# Patient Record
Sex: Female | Born: 1965
Health system: Southern US, Community
[De-identification: ages and names within clinical notes are randomized; demographics above are authoritative.]

## PROBLEM LIST (undated history)

## (undated) DIAGNOSIS — M25372 Other instability, left ankle: Secondary | ICD-10-CM

## (undated) DIAGNOSIS — I10 Essential (primary) hypertension: Secondary | ICD-10-CM

## (undated) DIAGNOSIS — R531 Weakness: Secondary | ICD-10-CM

## (undated) DIAGNOSIS — R200 Anesthesia of skin: Secondary | ICD-10-CM

## (undated) DIAGNOSIS — M21372 Foot drop, left foot: Secondary | ICD-10-CM

## (undated) DIAGNOSIS — F419 Anxiety disorder, unspecified: Secondary | ICD-10-CM

## (undated) DIAGNOSIS — M6702 Short Achilles tendon (acquired), left ankle: Secondary | ICD-10-CM

## (undated) HISTORY — DX: Essential (primary) hypertension: I10

## (undated) HISTORY — DX: Short Achilles tendon (acquired), left ankle: M67.02

## (undated) HISTORY — DX: Other instability, left ankle: M25.372

## (undated) HISTORY — DX: Weakness: R53.1

## (undated) HISTORY — DX: Foot drop, left foot: M21.372

## (undated) HISTORY — DX: Anxiety disorder, unspecified: F41.9

## (undated) HISTORY — DX: Anesthesia of skin: R20.0

---
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
## 2017-03-11 DIAGNOSIS — F331 Major depressive disorder, recurrent, moderate: Secondary | ICD-10-CM | POA: Diagnosis not present

## 2017-03-11 DIAGNOSIS — F411 Generalized anxiety disorder: Secondary | ICD-10-CM | POA: Diagnosis not present

## 2017-04-29 DIAGNOSIS — F331 Major depressive disorder, recurrent, moderate: Secondary | ICD-10-CM | POA: Diagnosis not present

## 2017-04-29 DIAGNOSIS — F411 Generalized anxiety disorder: Secondary | ICD-10-CM | POA: Diagnosis not present

## 2017-05-26 DIAGNOSIS — Z6825 Body mass index (BMI) 25.0-25.9, adult: Secondary | ICD-10-CM | POA: Diagnosis not present

## 2017-05-26 DIAGNOSIS — N951 Menopausal and female climacteric states: Secondary | ICD-10-CM | POA: Diagnosis not present

## 2017-05-26 DIAGNOSIS — R5382 Chronic fatigue, unspecified: Secondary | ICD-10-CM | POA: Diagnosis not present

## 2017-06-10 DIAGNOSIS — F331 Major depressive disorder, recurrent, moderate: Secondary | ICD-10-CM | POA: Diagnosis not present

## 2017-06-10 DIAGNOSIS — F411 Generalized anxiety disorder: Secondary | ICD-10-CM | POA: Diagnosis not present

## 2017-08-14 DIAGNOSIS — F411 Generalized anxiety disorder: Secondary | ICD-10-CM | POA: Diagnosis not present

## 2017-08-14 DIAGNOSIS — F331 Major depressive disorder, recurrent, moderate: Secondary | ICD-10-CM | POA: Diagnosis not present

## 2017-09-08 DIAGNOSIS — E039 Hypothyroidism, unspecified: Secondary | ICD-10-CM | POA: Diagnosis not present

## 2017-09-08 DIAGNOSIS — M6281 Muscle weakness (generalized): Secondary | ICD-10-CM | POA: Diagnosis not present

## 2017-09-08 DIAGNOSIS — R292 Abnormal reflex: Secondary | ICD-10-CM | POA: Diagnosis not present

## 2017-09-08 DIAGNOSIS — F329 Major depressive disorder, single episode, unspecified: Secondary | ICD-10-CM | POA: Diagnosis not present

## 2017-10-13 ENCOUNTER — Telehealth: Payer: Self-pay | Admitting: *Deleted

## 2017-10-13 NOTE — Telephone Encounter (Signed)
Called again and was able to speak with patient and her husband. They verbalized understanding that the office is closed tomorrow and have asked for an appt ASAP as the patient has been waiting over a month to be seen and they both took off work for appt. I expressed my sincere apologies and explained that the snow has caused the roads to be unsafe and we would reopen as soon as we could and call patients back to reschedule. They do not want to wait another month to be seen. Patient is available Thursday, Friday, Saturday. I informed husband that we had patients scheduled that day but we would do the best we could to get her in. He verbalized understanding.

## 2017-10-14 ENCOUNTER — Ambulatory Visit: Payer: BLUE CROSS/BLUE SHIELD | Admitting: Neurology

## 2017-10-15 ENCOUNTER — Other Ambulatory Visit: Payer: Self-pay | Admitting: Neurology

## 2017-10-15 ENCOUNTER — Ambulatory Visit: Payer: BLUE CROSS/BLUE SHIELD | Admitting: Neurology

## 2017-10-15 ENCOUNTER — Telehealth: Payer: Self-pay | Admitting: Neurology

## 2017-10-15 ENCOUNTER — Encounter (INDEPENDENT_AMBULATORY_CARE_PROVIDER_SITE_OTHER): Payer: Self-pay

## 2017-10-15 ENCOUNTER — Encounter: Payer: Self-pay | Admitting: Neurology

## 2017-10-15 VITALS — BP 103/66 | HR 72 | Ht 64.0 in | Wt 140.4 lb

## 2017-10-15 DIAGNOSIS — G1221 Amyotrophic lateral sclerosis: Secondary | ICD-10-CM

## 2017-10-15 DIAGNOSIS — G959 Disease of spinal cord, unspecified: Secondary | ICD-10-CM | POA: Diagnosis not present

## 2017-10-15 DIAGNOSIS — R5382 Chronic fatigue, unspecified: Secondary | ICD-10-CM | POA: Diagnosis not present

## 2017-10-15 DIAGNOSIS — R29818 Other symptoms and signs involving the nervous system: Secondary | ICD-10-CM | POA: Diagnosis not present

## 2017-10-15 DIAGNOSIS — R251 Tremor, unspecified: Secondary | ICD-10-CM | POA: Diagnosis not present

## 2017-10-15 DIAGNOSIS — R258 Other abnormal involuntary movements: Secondary | ICD-10-CM | POA: Diagnosis not present

## 2017-10-15 DIAGNOSIS — R292 Abnormal reflex: Secondary | ICD-10-CM

## 2017-10-15 DIAGNOSIS — G35 Multiple sclerosis: Secondary | ICD-10-CM | POA: Diagnosis not present

## 2017-10-15 DIAGNOSIS — G35D Multiple sclerosis, unspecified: Secondary | ICD-10-CM

## 2017-10-15 DIAGNOSIS — G1229 Other motor neuron disease: Secondary | ICD-10-CM

## 2017-10-15 DIAGNOSIS — R531 Weakness: Secondary | ICD-10-CM

## 2017-10-15 MED ORDER — ALPRAZOLAM 0.5 MG PO TABS: ORAL_TABLET | ORAL | 0 refills | Status: DC

## 2017-10-15 MED ORDER — ALPRAZOLAM 0.25 MG PO TABS: ORAL_TABLET | ORAL | 0 refills | Status: DC

## 2017-10-15 NOTE — Telephone Encounter (Signed)
See if she can come today thanks 

## 2017-10-15 NOTE — Progress Notes (Signed)
[facility]    Provider:  Dr Jaynee Eagles Referring Provider: Manon Hilding, MD Primary Care Physician:  Manon Hilding, MD  CC:  Muscle weakness, hyperreflexia, tremor, hands shake  HPI:  Megan Wells is a 51 y.o. female here as a referral from Manon Hilding, MD for hyperreflexia and leg weakness.  Past medical history of hypothyroidism, major depression on lithium and multiple other mood drugs.  Muscle weakness which is generalized, abnormal weight loss, arthralgia of left TMJ.  She started having tremors over a year ago in the hands, now in the legs she feels her left leg is weak and she can;t pick it up she falls. She has had 4 falls. Leg is continuously weak but episodic with flares some days can walk better. Tremors are worse in the morning. Tremors are more when she is doing something, action, drinking not when sitting still. Slowly progressive leg weakness. Tremor is stable. When the tremor started no medication changes or inciting events, Lexapro was added 7 months ago but has been on the lithium and wellbutrin for years but she did increase the Lithium 2 or 3 years ago. She has low back pain and has arthritis in her low back, she is also on No tremors in the family. No pain in the low back, no radicular symptoms,  o numbness, tingling, weakness in the left leg but no other weakness. No FHx of autoimmune disease, MS, neuromuscular disorder or tremor, no changes in bowel or bladder. Her leg gives out, she cam;t pick it up. No muscle wasting or weight loss, no problems, speaking, swallowing, chewing, she feels unstable.   Reviewed notes, labs and imaging from outside physicians, which showed:   Reviewed primary care notes.  She presented with jaw pain on the left.  Acute, gradual in onset 2 weeks prior, in addition she presented with leg pain, left sciatic weakness, acute restarted 3-4 weeks ago also dyskinesia or tremor diffusely, described as acute, gradual in onset and  ongoing.  She is on lithium, he Spyro, Lexapro, bupropion.  The patient has anxiety and hypothyroid.And apparently C met, CBC, TSH and CK were ordered but I do not have the results.   Review of Systems: Patient complains of symptoms per HPI as well as the following symptoms: increased thirst, memory loss, sleepiness, weakness, dizziness, tremor, depression, anxiety. Pertinent negatives and positives per HPI. All others negative.   Social History   Socioeconomic History  . Marital status: Married    Spouse name: Not on file  . Number of children: 2  . Years of education: Not on file  . Highest education level: Bachelor's degree (e.g., BA, AB, BS)  Social Needs  . Financial resource strain: Not on file  . Food insecurity - worry: Not on file  . Food insecurity - inability: Not on file  . Transportation needs - medical: Not on file  . Transportation needs - non-medical: Not on file  Occupational History  . Occupation: alumni relations    Comment: Research scientist (medical)  Tobacco Use  . Smoking status: Former Smoker    Types: Cigarettes    Last attempt to quit: 1971    Years since quitting: 47.9  . Smokeless tobacco: Never Used  Substance and Sexual Activity  . Alcohol use: No    Frequency: Never    Comment: quit March 2018  . Drug use: No  . Sexual activity: Not on file  Other Topics Concern  . Not on file  Social  History Narrative   Lives at home with husband   Left handed   2 cups of coffee daily    Family History  Problem Relation Age of Onset  . Alzheimer's disease Mother   . Heart disease Father     Past Medical History:  Diagnosis Date  . Anxiety   . Hypertension     Past Surgical History:  Procedure Laterality Date  . TONSILLECTOMY  1974    Current Outpatient Medications  Medication Sig Dispense Refill  . buPROPion (WELLBUTRIN XL) 300 MG 24 hr tablet Take 300 mg by mouth daily.    . busPIRone (BUSPAR) 15 MG tablet Take 15 mg by mouth 2 (two) times daily.     Marland Kitchen escitalopram (LEXAPRO) 20 MG tablet Take 20 mg by mouth 2 (two) times daily.    Marland Kitchen levonorgestrel-ethinyl estradiol (AVIANE,ALESSE,LESSINA) 0.1-20 MG-MCG tablet Take 1 tablet by mouth daily.    Marland Kitchen levothyroxine (SYNTHROID, LEVOTHROID) 125 MCG tablet Take 125 mcg by mouth daily before breakfast.    . lithium carbonate 300 MG capsule Take 300 mg by mouth 3 (three) times daily with meals.    Marland Kitchen losartan (COZAAR) 50 MG tablet Take 50 mg by mouth 2 (two) times daily.    . nabumetone (RELAFEN) 500 MG tablet Take 500 mg by mouth 2 (two) times daily.    Marland Kitchen ALPRAZolam (XANAX) 0.25 MG tablet Take 102 tablets 30-60 minutes before MRI. May repeat if needed. Do not drive. 5 tablet 0   No current facility-administered medications for this visit.     Allergies as of 10/15/2017 - Review Complete 10/15/2017  Allergen Reaction Noted  . Sulfa antibiotics Hives 10/15/2017    Vitals: BP 103/66 (BP Location: Right Arm, Patient Position: Sitting)   Pulse 72   Ht 5' 4" (1.626 m)   Wt 140 lb 6.4 oz (63.7 kg)   BMI 24.10 kg/m  Last Weight:  Wt Readings from Last 1 Encounters:  10/15/17 140 lb 6.4 oz (63.7 kg)   Last Height:   Ht Readings from Last 1 Encounters:  10/15/17 5' 4" (1.626 m)    Physical exam: Exam: Gen: NAD, conversant, well nourised,  well groomed                     CV: RRR, no MRG. No Carotid Bruits. No peripheral edema, warm, nontender Eyes: Conjunctivae clear without exudates or hemorrhage  Neuro: Detailed Neurologic Exam  Speech:    Speech is normal; fluent and spontaneous with normal comprehension.  Cognition:    The patient is oriented to person, place, and time;     recent and remote memory intact;     language fluent;     normal attention, concentration,     fund of knowledge Cranial Nerves:    The pupils are equal, round, and reactive to light. The fundi are normal and spontaneous venous pulsations are present. Visual fields are full to finger confrontation.  Extraocular movements are intact. Trigeminal sensation is intact and the muscles of mastication are normal. The face is symmetric. The palate elevates in the midline. Hearing intact. Voice is normal. Shoulder shrug is normal. The tongue has normal motion without fasciculations.   Coordination:    Normal finger to nose and heel to shin. Normal rapid alternating movements.   Gait:  Needs to use arms to get out of seat.  Tandem with some difficulty.  Heel-toe gait are normal.  Drags left leg.   Motor Observation:  High frequency, low amplitude positional and kinetic tremor, no resting tremor Tone:    inctresed muscle tone and spasticity left > right LE   Posture:    Posture is normal. normal erect    Strength:    Strength is V/V in the upper and lower limbs.      Sensation: intact to LT     Reflex Exam:  DTR's:    Deep tendon reflexes in the upper and lower extremities are brisk bilaterally.  +4 bilateral patellars and AJs, left side if more brisk than the right Toes:    The toes are equivbilaterally.   Clonus:    Clonus in the LE with left AJ persistent clonus.      Assessment/Plan:  51 year old with multiple neurologic symptoms including progressive tremor in the UE, left leg weakness, ataxia, falls. She has significant clonus left leg and left arm, overall brisk reflexes, with spasticity/increased tone bilateral lowers. Need to evaluate for upper motor neuron lesion.  She is on multiple seratonin drugs, need to be vigilant for seratonin syndrome  Relafen and losartan may increase lithium levels, check lithium level today  Tremor: May be essential tremor or medication related, she is on multiple medications that can cause tremor  Left leg weakness, ataxia, falls, spasticity, clonus: Need MRI brain w/wo contrast to evaluate for upper motor neuron lesion as well as MRI cervical spine to evaluate for cervical myelopathy or other spinal cord disorder to explain symptoms such as  MS  Request CK, cmp, cbc and tsh labs from pcp did not receive  Orders Placed This Encounter  Procedures  . MR BRAIN W WO CONTRAST  . MR CERVICAL SPINE W WO CONTRAST  . Lithium level  . Basic Metabolic Panel      , MD  [facility] [address] [place], [place] [address]  Phone [phone] Fax [phone]  

## 2017-10-15 NOTE — Telephone Encounter (Signed)
Called and spoke w/ patient. I r/s her for today 12/12 at 2:30 with an arrival time of 2:00. She verbalized understanding and appreciation for the call.

## 2017-10-15 NOTE — Telephone Encounter (Signed)
Dr. Lucia GaskinsAhern would like patient scheduled for MRI Next week on Mobile unit. Patient and Dr. Lucia GaskinsAhern are Lucia Gaskinshern you are out of the office for the day. Thanks Annabelle Harmanana.

## 2017-10-15 NOTE — Progress Notes (Signed)
Requested [facility] to be [facility].

## 2017-10-16 LAB — BASIC METABOLIC PANEL
BUN / CREAT RATIO: 10 (ref 9–23)
BUN: 8 mg/dL (ref 6–24)
CO2: 24 mmol/L (ref 20–29)
CREATININE: 0.79 mg/dL (ref 0.57–1.00)
Calcium: 9.5 mg/dL (ref 8.7–10.2)
Chloride: 104 mmol/L (ref 96–106)
GFR, EST AFRICAN AMERICAN: 100 mL/min/{1.73_m2} (ref >59)
GFR, EST NON AFRICAN AMERICAN: 87 mL/min/{1.73_m2} (ref >59)
Glucose: 85 mg/dL (ref 65–99)
Potassium: 4.6 mmol/L (ref 3.5–5.2)
Sodium: 137 mmol/L (ref 134–144)

## 2017-10-16 LAB — LITHIUM LEVEL: Lithium Lvl: 1.2 mmol/L (ref 0.6–1.2)

## 2017-10-16 NOTE — Telephone Encounter (Signed)
Noted, patient is scheduled for 10/22/17 for the [facility] mobile unit.

## 2017-10-17 ENCOUNTER — Telehealth: Payer: Self-pay | Admitting: *Deleted

## 2017-10-17 NOTE — Telephone Encounter (Signed)
Called and spoke with patient regarding lab results. I informed her that her labs are normal and the next step will be for her to get MRIs done and then we will call with the results. She verbalized understanding and appreciation for the call.

## 2017-10-17 NOTE — Telephone Encounter (Signed)
-----   Message from Anson Fret, MD sent at 10/16/2017  5:47 PM EST ----- Labs normal, pending MRIs next thanks

## 2017-10-22 ENCOUNTER — Telehealth: Payer: Self-pay | Admitting: Neurology

## 2017-10-22 ENCOUNTER — Other Ambulatory Visit: Payer: BLUE CROSS/BLUE SHIELD

## 2017-10-22 NOTE — Telephone Encounter (Signed)
Patient is schedule to have her MRI Brain w/wo contrast & MR Cervical spine w/wo contrast at [facility] for Friday 10/24/17 arrival time is 2:45 pm patient is aware.

## 2017-10-22 NOTE — Telephone Encounter (Signed)
Megan Wells with [facility] is calling to get an order for an MRI faxed to [phone]. Megan Wells can be reached at [phone] Option 3 if there are questions.

## 2017-10-22 NOTE — Telephone Encounter (Signed)
Spoke to De QueenKay and informed her Dr. Lucia GaskinsAhern just signed it and that I was faxing now.

## 2017-10-24 DIAGNOSIS — R251 Tremor, unspecified: Secondary | ICD-10-CM | POA: Diagnosis not present

## 2017-10-24 DIAGNOSIS — M50222 Other cervical disc displacement at C5-C6 level: Secondary | ICD-10-CM | POA: Diagnosis not present

## 2017-10-29 ENCOUNTER — Other Ambulatory Visit: Payer: BLUE CROSS/BLUE SHIELD

## 2017-10-31 ENCOUNTER — Telehealth: Payer: Self-pay | Admitting: Neurology

## 2017-10-31 NOTE — Telephone Encounter (Signed)
Spoke with Dr. Marjory Lies who viewed pt's MRI brain & cervical spine results. Per Dr. Marjory Lies:   MRI brain showed small spots of scar tissue but no major concerns or serious findings. MRI c-spine no concerns.   I called the patient and discussed her MRI results of brain & cervical spine. Her questions were answered. She asked if she can be seen sooner than her f/u appt and is concerned about her condition and is "highly agitated" saying that it is causing her to miss work sometimes and she has even tripped and fallen. She is concerned that she could fall and injure herself. I attempted to provide emotional support. I looked and did not see any near future open office visits. I advised her that the office is closed until Wednesday but that I would send this message to Dr. Lucia Gaskins as I do not see any open office visits sooner than her December 15, 2017 follow up. She verbalized understanding and appreciation.

## 2017-10-31 NOTE — Telephone Encounter (Signed)
Called patient and let her know that I have not been made aware of any results ready for review. She stated that she had her MRI done at [facility] on Friday 12/21. She is anxious to get results. She is aware that the office is closed until January 2nd. I informed her that I would send messages to the ladies who deal with with MRI/medical records so we can get these results as soon as possible. She verbalized understanding and appreciation.

## 2017-10-31 NOTE — Telephone Encounter (Signed)
Pt is wanting to discuss the results of her MRI with Dr Lucia Gaskins or RN. Pt is aware that both will not be back until 11/05/2017

## 2017-10-31 NOTE — Telephone Encounter (Signed)
I went ahead and called [facility] and requested the MRI brain & cervical spine. They will be faxing the results to the office.

## 2017-11-10 ENCOUNTER — Telehealth: Payer: Self-pay | Admitting: Neurology

## 2017-11-10 NOTE — Telephone Encounter (Signed)
Pt has called back in response to message that she received from Dr Lucia Gaskins this morning re: the MRI that is needed.  MRI coordinator Irving Burton confirmed that she has no orders re: scheduling a MRI for pt.  Pt is asking to be called as to what she needs to do

## 2017-11-10 NOTE — Telephone Encounter (Signed)
Called and spoke with patient. She agrees to do the MRI lumbar & thoracic spine. She will wait for call to schedule MRI then will call us when MRI scheduled. Dr. Lucia Gaskins would like to see pt in office soon after MRI done for review. She verbalized understanding and appreciation.

## 2017-11-10 NOTE — Telephone Encounter (Signed)
Left a message for patient. I would like Thoracic and lumbar MRIs and then meet as soon as they are completed. Asked her to call us back this morning. thanks

## 2017-11-11 ENCOUNTER — Other Ambulatory Visit: Payer: Self-pay | Admitting: Neurology

## 2017-11-11 DIAGNOSIS — R252 Cramp and spasm: Secondary | ICD-10-CM

## 2017-11-11 DIAGNOSIS — R29898 Other symptoms and signs involving the musculoskeletal system: Secondary | ICD-10-CM

## 2017-11-11 DIAGNOSIS — R339 Retention of urine, unspecified: Secondary | ICD-10-CM

## 2017-11-11 DIAGNOSIS — R270 Ataxia, unspecified: Secondary | ICD-10-CM

## 2017-11-11 DIAGNOSIS — G834 Cauda equina syndrome: Secondary | ICD-10-CM

## 2017-11-11 DIAGNOSIS — M5410 Radiculopathy, site unspecified: Secondary | ICD-10-CM

## 2017-11-11 DIAGNOSIS — W19XXXA Unspecified fall, initial encounter: Secondary | ICD-10-CM

## 2017-11-11 NOTE — Telephone Encounter (Signed)
Ordered meri thoracic and lumbar. Will ask Irving Burton to see if she can expedite give patient being highly agitated per patient.

## 2017-11-11 NOTE — Telephone Encounter (Signed)
I tried to get both of the MRI's approved but I was unable to get it approved on my level. I called [employer] and spoke to one of their nurse's and gave more clinical information and she was unable to get it approved due to the combination of the study. The phone number for the peer to peer is [phone]. The member ID is [id number] and DOB is November 18, 1965. The case does close on Thursday 11/13/17.

## 2017-11-12 MED ORDER — ALPRAZOLAM 0.25 MG PO TABS: ORAL_TABLET | ORAL | 0 refills | Status: DC

## 2017-11-12 NOTE — Addendum Note (Signed)
Addended by: Bertram Savin on: 11/12/2017 10:39 AM   Modules accepted: Orders

## 2017-11-12 NOTE — Telephone Encounter (Signed)
Yeah I withdrew the case with the Lumbar and started a new case with just the Thoracic and I was able to get that approved. Do you want me to proceed with her having the Thoracic for now?

## 2017-11-12 NOTE — Telephone Encounter (Signed)
The patient is schedule for our office [facility] for Tuesday 11/18/17. The patient is wondering if she could have more valium because she took 1 to try to see how she felt then took 2 the day she was suppose to come in our office for the Brain/Cervical but wasn't able to have it due to the mobile unit not working.. Then she took the last 2 the day she went to [facility] to have the Brain/Cervical. Please advise.

## 2017-11-12 NOTE — Telephone Encounter (Signed)
Xanax prescription signed & faxed to pt's pharmacy. Received a receipt of confirmation. Patient called and made aware. She verbalized understanding and appreciation.

## 2017-11-12 NOTE — Telephone Encounter (Signed)
Xanax reordered per v.o. From Dr. Lucia Gaskins, 4 tablets, 0 refill. Rx printed, ready for signature.

## 2017-11-12 NOTE — Telephone Encounter (Signed)
Will they approve the thoracic without the lumbar?

## 2017-11-12 NOTE — Telephone Encounter (Signed)
YES THANK YOU! 

## 2017-11-18 ENCOUNTER — Ambulatory Visit: Payer: BLUE CROSS/BLUE SHIELD

## 2017-11-18 DIAGNOSIS — R270 Ataxia, unspecified: Secondary | ICD-10-CM | POA: Diagnosis not present

## 2017-11-18 DIAGNOSIS — R29898 Other symptoms and signs involving the musculoskeletal system: Secondary | ICD-10-CM

## 2017-11-18 DIAGNOSIS — M5410 Radiculopathy, site unspecified: Secondary | ICD-10-CM

## 2017-11-18 DIAGNOSIS — G834 Cauda equina syndrome: Secondary | ICD-10-CM | POA: Diagnosis not present

## 2017-11-18 DIAGNOSIS — R252 Cramp and spasm: Secondary | ICD-10-CM | POA: Diagnosis not present

## 2017-11-18 DIAGNOSIS — R339 Retention of urine, unspecified: Secondary | ICD-10-CM

## 2017-11-18 DIAGNOSIS — W19XXXA Unspecified fall, initial encounter: Secondary | ICD-10-CM

## 2017-11-19 NOTE — Telephone Encounter (Signed)
error 

## 2017-11-21 ENCOUNTER — Telehealth: Payer: Self-pay | Admitting: *Deleted

## 2017-11-21 NOTE — Telephone Encounter (Signed)
-----   Message from Anson Fret, MD sent at 11/21/2017  9:10 AM EST ----- MRI Thoracic spine did not show any lesions in the spinal cord to account for symptoms. In one of the bones of the spine, some blood vessels were seen. This is a common finding but we should perform a CT of the spine just to verify. We can discuss findings and next steps at appointment, please call and see if she can come next week thanks

## 2017-11-21 NOTE — Telephone Encounter (Signed)
Called patient and discussed that per Dr. Lucia Gaskins MRI thoracic spine looked ok. She does not want to see the patient in the office to go over it and discuss the next steps. The patient verbalized understanding and appreciation and I scheduled the patient on the phone for f/u with Dr. Lucia Gaskins this coming Monday 11/24/17 @ 11:30 with an arrival time of 11:00.

## 2017-11-24 ENCOUNTER — Encounter: Payer: Self-pay | Admitting: Neurology

## 2017-11-24 ENCOUNTER — Ambulatory Visit: Payer: BLUE CROSS/BLUE SHIELD | Admitting: Neurology

## 2017-11-24 VITALS — BP 102/70 | HR 67 | Ht 64.0 in | Wt 143.4 lb

## 2017-11-24 DIAGNOSIS — G1223 Primary lateral sclerosis: Secondary | ICD-10-CM

## 2017-11-24 DIAGNOSIS — G1221 Amyotrophic lateral sclerosis: Secondary | ICD-10-CM | POA: Diagnosis not present

## 2017-11-24 DIAGNOSIS — G1229 Other motor neuron disease: Secondary | ICD-10-CM

## 2017-11-24 DIAGNOSIS — R252 Cramp and spasm: Secondary | ICD-10-CM | POA: Diagnosis not present

## 2017-11-24 NOTE — Progress Notes (Signed)
[facility]    Provider:  Dr Jaynee Eagles Referring Provider: Manon Hilding, MD Primary Care Physician:  Manon Hilding, MD  CC:  Muscle weakness, hyperreflexia  Interval history 11/25/2017: MRI brain, cervical spine and thoracic spine did not show any etiology for upper motor neuron lesions. Spent an extensive amount of time with patient and her husband discussing other possibilities including neurodegenrative causes such as HSP and PLS. Will order extensive lab testing today and refer to Dr. Vallarie Mare at [facility] for possible PLS.   HPI:  Megan Wells is a 52 y.o. female here as a referral from Manon Hilding, MD for hyperreflexia and leg weakness.  Past medical history of hypothyroidism, major depression on lithium and multiple other mood drugs.  Muscle weakness which is generalized, abnormal weight loss, arthralgia of left TMJ.  She started having tremors over a year ago in the hands, now in the legs she feels her left leg is weak and she can;t pick it up she falls. She has had 4 falls. Leg is continuously weak but episodic with flares some days can walk better. Tremors are worse in the morning. Tremors are more when she is doing something, action, drinking not when sitting still. Slowly progressive leg weakness. Tremor is stable. When the tremor started no medication changes or inciting events, Lexapro was added 7 months ago but has been on the lithium and wellbutrin for years but she did increase the Lithium 2 or 3 years ago. She has low back pain and has arthritis in her low back, she is also on No tremors in the family. No pain in the low back, no radicular symptoms,  o numbness, tingling, weakness in the left leg but no other weakness. No FHx of autoimmune disease, MS, neuromuscular disorder or tremor, no changes in bowel or bladder. Her leg gives out, she cam;t pick it up. No muscle wasting or weight loss, no problems, speaking, swallowing, chewing, she feels  unstable.   Reviewed notes, labs and imaging from outside physicians, which showed:   Reviewed primary care notes.  She presented with jaw pain on the left.  Acute, gradual in onset 2 weeks prior, in addition she presented with leg pain, left sciatic weakness, acute restarted 3-4 weeks ago also dyskinesia or tremor diffusely, described as acute, gradual in onset and ongoing.  She is on lithium, he Spyro, Lexapro, bupropion.  The patient has anxiety and hypothyroid.And apparently C met, CBC, TSH and CK were ordered but I do not have the results.   Review of Systems: Patient complains of symptoms per HPI as well as the following symptoms: increased thirst, memory loss, sleepiness, weakness, dizziness, tremor, depression, anxiety. Pertinent negatives and positives per HPI. All others negative.     Social History   Socioeconomic History  . Marital status: Married    Spouse name: Not on file  . Number of children: 2  . Years of education: Not on file  . Highest education level: Bachelor's degree (e.g., BA, AB, BS)  Social Needs  . Financial resource strain: Not on file  . Food insecurity - worry: Not on file  . Food insecurity - inability: Not on file  . Transportation needs - medical: Not on file  . Transportation needs - non-medical: Not on file  Occupational History  . Occupation: alumni relations    Comment: Research scientist (medical)  Tobacco Use  . Smoking status: Former Smoker    Packs/day: 0.50    Years: 13.00  Pack years: 6.50    Types: Cigarettes    Start date: 98    Last attempt to quit: 2000    Years since quitting: 19.0  . Smokeless tobacco: Never Used  Substance and Sexual Activity  . Alcohol use: No    Frequency: Never    Comment: quit March 2018  . Drug use: No  . Sexual activity: Not on file  Other Topics Concern  . Not on file  Social History Narrative   Lives at home with husband   Left handed   2 cups of coffee daily    Family History  Problem  Relation Age of Onset  . Alzheimer's disease Mother   . Heart disease Father     Past Medical History:  Diagnosis Date  . Anxiety   . Hypertension     Past Surgical History:  Procedure Laterality Date  . TONSILLECTOMY  1974    Current Outpatient Medications  Medication Sig Dispense Refill  . ALPRAZolam (XANAX) 0.25 MG tablet Take 1-2 tablets 30-60 minutes before MRI. May repeat if needed. Do not drive. 4 tablet 0  . buPROPion (WELLBUTRIN XL) 300 MG 24 hr tablet Take 300 mg by mouth daily.    . busPIRone (BUSPAR) 15 MG tablet Take 15 mg by mouth 2 (two) times daily.    Marland Kitchen escitalopram (LEXAPRO) 20 MG tablet Take 20 mg by mouth 2 (two) times daily.    Marland Kitchen levonorgestrel-ethinyl estradiol (AVIANE,ALESSE,LESSINA) 0.1-20 MG-MCG tablet Take 1 tablet by mouth daily.    Marland Kitchen levothyroxine (SYNTHROID, LEVOTHROID) 125 MCG tablet Take 125 mcg by mouth daily before breakfast.    . lithium carbonate 300 MG capsule Take 300 mg by mouth 3 (three) times daily with meals.    Marland Kitchen losartan (COZAAR) 50 MG tablet Take 50 mg by mouth 2 (two) times daily.     No current facility-administered medications for this visit.     Allergies as of 11/24/2017 - Review Complete 11/24/2017  Allergen Reaction Noted  . Sulfa antibiotics Hives 10/15/2017    Vitals: BP 102/70 (BP Location: Right Arm, Patient Position: Sitting)   Pulse 67   Ht '5\' 4"'  (1.626 m)   Wt 143 lb 6.4 oz (65 kg)   BMI 24.61 kg/m  Last Weight:  Wt Readings from Last 1 Encounters:  11/24/17 143 lb 6.4 oz (65 kg)   Last Height:   Ht Readings from Last 1 Encounters:  11/24/17 '5\' 4"'  (1.626 m)         Assessment/Plan:   Physical exam: Exam: Gen: NAD, conversant, well nourised,  well groomed                     CV: RRR, no MRG. No Carotid Bruits. No peripheral edema, warm, nontender Eyes: Conjunctivae clear without exudates or hemorrhage  Neuro: Detailed Neurologic Exam  Speech:    Speech is normal; fluent and spontaneous with  normal comprehension.  Cognition:    The patient is oriented to person, place, and time;     recent and remote memory intact;     language fluent;     normal attention, concentration,     fund of knowledge Cranial Nerves:    The pupils are equal, round, and reactive to light. The fundi are normal and spontaneous venous pulsations are present. Visual fields are full to finger confrontation. Extraocular movements are intact. Trigeminal sensation is intact and the muscles of mastication are normal. The face is symmetric. The palate elevates in  the midline. Hearing intact. Voice is normal. Shoulder shrug is normal. The tongue has normal motion without fasciculations.   Coordination:    Normal finger to nose and heel to shin. Normal rapid alternating movements.   Gait:  Needs to use arms to get out of seat.  Tandem with some difficulty.  Heel-toe gait are normal.  Drags left leg.   Motor Observation:    High frequency, low amplitude positional and kinetic tremor, no resting tremor Tone:    inctresed muscle tone and spasticity left > right LE   Posture:    Posture is normal. normal erect    Strength:    Strength is V/V in the upper and lower limbs.      Sensation: intact to LT     Reflex Exam:  DTR's:    Deep tendon reflexes in the upper and lower extremities are brisk bilaterally.  +4 bilateral patellars and AJs, left side if more brisk than the right Toes:    The toes are equivbilaterally.   Clonus:    Clonus in the LE with left AJ persistent clonus.      Assessment/Plan:  52 year old with multiple neurologic symptoms including progressive tremor in the UE, left leg weakness, ataxia, falls. She has significant clonus left leg and left arm, overall brisk reflexes, with spasticity/increased tone bilateral lowers.   MRI brain, cervical spine and thoracic spine did not show any etiology for upper motor neuron lesions. Spent an extensive amount of time with patient and her  husband discussing other possibilities including neurodegenrative causes such as HSP and PLS. Will order extensive lab testing today and refer to Dr. Vallarie Mare at [facility] for possible PLS.   She is on multiple seratonin drugs, need to be vigilant for seratonin syndrome  Relafen and losartan may increase lithium levels, lithium level was within normal limits  Tremor: May be essential tremor or medication related, she is on multiple medications that can cause tremor  Orders Placed This Encounter  Procedures  . HTLV 1+2 antibodies, (EIA), bld  . Copper, serum  . CK  . TSH  . HIV antibody  . ANA w/Reflex  . Sjogren's syndrome antibods(ssa + ssb)  . Sedimentation rate  . B12 and Folate Panel  . RPR  . Hepatitis C antibody  . Paraneoplastic Profile 1  . Heavy metals, blood  . Vitamin B6  . Vitamin B1  . Methylmalonic acid, serum  . CBC  . Comprehensive metabolic panel  . Angiotensin converting enzyme  . Rheumatoid factor  . Magnesium  . Tissue transglutaminase, IgA  . Gliadin antibodies, serum  . Vitamin E  . Ambulatory referral to Neurology   Cc: Dr. Wray Kearns, [facility] [address] [place], [place] [address]  Phone [phone] Fax [phone]  A total of 45 minutes was spent face-to-face with this patient. Over half this time was spent on counseling patient on the upper motor lesion diagnosis and different diagnostic and therapeutic options available.

## 2017-11-25 DIAGNOSIS — R252 Cramp and spasm: Secondary | ICD-10-CM | POA: Insufficient documentation

## 2017-11-26 ENCOUNTER — Telehealth: Payer: Self-pay | Admitting: Neurology

## 2017-11-26 NOTE — Telephone Encounter (Signed)
Dr. Lucia Gaskins Please advise on Referral [facility] . [facility] is not taking her insurance at this time .   Dr. Alphonzo Dublin at [facility] for [facility]

## 2017-11-27 ENCOUNTER — Telehealth: Payer: Self-pay | Admitting: *Deleted

## 2017-11-27 DIAGNOSIS — F331 Major depressive disorder, recurrent, moderate: Secondary | ICD-10-CM | POA: Diagnosis not present

## 2017-11-27 DIAGNOSIS — F411 Generalized anxiety disorder: Secondary | ICD-10-CM | POA: Diagnosis not present

## 2017-11-27 LAB — ANGIOTENSIN CONVERTING ENZYME: Angio Convert Enzyme: 39 U/L (ref 14–82)

## 2017-11-27 LAB — SJOGREN'S SYNDROME ANTIBODS(SSA + SSB)
ENA SSA (RO) Ab: <0.2 AI (ref 0.0–0.9)
ENA SSB (LA) Ab: <0.2 AI (ref 0.0–0.9)

## 2017-11-27 LAB — CBC
Hematocrit: 35.1 % (ref 34.0–46.6)
Hemoglobin: 11.8 g/dL (ref 11.1–15.9)
MCH: 30.7 pg (ref 26.6–33.0)
MCHC: 33.6 g/dL (ref 31.5–35.7)
MCV: 91 fL (ref 79–97)
PLATELETS: 324 10*3/uL (ref 150–379)
RBC: 3.84 x10E6/uL (ref 3.77–5.28)
RDW: 14 % (ref 12.3–15.4)
WBC: 8.3 10*3/uL (ref 3.4–10.8)

## 2017-11-27 LAB — HIV ANTIBODY (ROUTINE TESTING W REFLEX): HIV SCREEN 4TH GENERATION: NONREACTIVE

## 2017-11-27 LAB — HEAVY METALS, BLOOD
Arsenic: 5 ug/L (ref 2–23)
LEAD, BLOOD: NOT DETECTED ug/dL (ref 0–4)
Mercury: NOT DETECTED ug/L (ref 0.0–14.9)

## 2017-11-27 LAB — COMPREHENSIVE METABOLIC PANEL
ALT: 12 IU/L (ref 0–32)
AST: 12 IU/L (ref 0–40)
Albumin/Globulin Ratio: 1.7 (ref 1.2–2.2)
Albumin: 4.1 g/dL (ref 3.5–5.5)
Alkaline Phosphatase: 36 IU/L — ABNORMAL LOW (ref 39–117)
BUN/Creatinine Ratio: 11 (ref 9–23)
BUN: 8 mg/dL (ref 6–24)
Bilirubin Total: 0.4 mg/dL (ref 0.0–1.2)
CO2: 22 mmol/L (ref 20–29)
CREATININE: 0.73 mg/dL (ref 0.57–1.00)
Calcium: 9.2 mg/dL (ref 8.7–10.2)
Chloride: 105 mmol/L (ref 96–106)
GFR, EST AFRICAN AMERICAN: 110 mL/min/{1.73_m2} (ref >59)
GFR, EST NON AFRICAN AMERICAN: 96 mL/min/{1.73_m2} (ref >59)
GLUCOSE: 89 mg/dL (ref 65–99)
Globulin, Total: 2.4 g/dL (ref 1.5–4.5)
Potassium: 4.3 mmol/L (ref 3.5–5.2)
Sodium: 139 mmol/L (ref 134–144)
TOTAL PROTEIN: 6.5 g/dL (ref 6.0–8.5)

## 2017-11-27 LAB — VITAMIN B1: THIAMINE: 115.2 nmol/L (ref 66.5–200.0)

## 2017-11-27 LAB — CK: Total CK: 48 U/L (ref 24–173)

## 2017-11-27 LAB — PARANEOPLASTIC PROFILE 1
Neuronal Nuc Ab (Ri), IFA: <1:10 {titer}
Purkinje Cell (Yo) Autoantobodies- IFA: <1:10 {titer}

## 2017-11-27 LAB — RHEUMATOID FACTOR: Rhuematoid fact SerPl-aCnc: <10 IU/mL (ref 0.0–13.9)

## 2017-11-27 LAB — METHYLMALONIC ACID, SERUM: Methylmalonic Acid: 217 nmol/L (ref 0–378)

## 2017-11-27 LAB — RPR: RPR Ser Ql: NONREACTIVE

## 2017-11-27 LAB — COPPER, SERUM: COPPER: 163 ug/dL (ref 72–166)

## 2017-11-27 LAB — B12 AND FOLATE PANEL
Folate: >20 ng/mL (ref >3.0)
VITAMIN B 12: 699 pg/mL (ref 232–1245)

## 2017-11-27 LAB — HTLV I+II ANTIBODIES, (EIA), BLD: HTLV I/II AB: NEGATIVE

## 2017-11-27 LAB — GLIADIN ANTIBODIES, SERUM
Antigliadin Abs, IgA: 3 units (ref 0–19)
Gliadin IgG: 3 units (ref 0–19)

## 2017-11-27 LAB — MAGNESIUM: MAGNESIUM: 2 mg/dL (ref 1.6–2.3)

## 2017-11-27 LAB — HEPATITIS C ANTIBODY

## 2017-11-27 LAB — SEDIMENTATION RATE: SED RATE: 9 mm/h (ref 0–40)

## 2017-11-27 LAB — VITAMIN E
VITAMIN E (ALPHA TOCOPHEROL): 15.8 mg/L (ref 7.0–25.1)
Vitamin E(Gamma Tocopherol): 0.5 mg/L (ref 0.5–5.5)

## 2017-11-27 LAB — ANA W/REFLEX: Anti Nuclear Antibody(ANA): NEGATIVE

## 2017-11-27 LAB — VITAMIN B6: Vitamin B6: 71.4 ug/L — ABNORMAL HIGH (ref 2.0–32.8)

## 2017-11-27 LAB — TSH: TSH: 3.63 u[IU]/mL (ref 0.450–4.500)

## 2017-11-27 LAB — TISSUE TRANSGLUTAMINASE, IGA: Transglutaminase IgA: <2 U/mL (ref 0–3)

## 2017-11-27 NOTE — Telephone Encounter (Addendum)
Spoke with patient. Discussed labs so far essentially normal, B6 elevated. She stated that off and on she takes a women's vitamin but not regularly at all. She verbalized appreciation for the call and had no questions.    ----- Message from Anson Fret, MD sent at 11/27/2017 11:20 AM EST ----- All labs so far essentially normal. Her B6 is elevated, does she take any supplements? thanks

## 2017-11-27 NOTE — Telephone Encounter (Signed)
Can you try [facility] please?

## 2017-11-27 NOTE — Telephone Encounter (Signed)
Megan Wells, how long would it takt to get an appointment at [facility]? Do they take her insurance? Neuromuscular physician for Pri,ary Lateral Sclerosis thanks

## 2017-11-28 ENCOUNTER — Encounter: Payer: Self-pay | Admitting: Neurology

## 2017-12-03 ENCOUNTER — Other Ambulatory Visit: Payer: Self-pay | Admitting: Neurology

## 2017-12-04 NOTE — Telephone Encounter (Signed)
Referral has been sent to [facility] [phone] - fax [phone].  [facility] will call me back with a date .   Order has been sent to [facility] [phone]

## 2017-12-05 ENCOUNTER — Other Ambulatory Visit: Payer: Self-pay | Admitting: Neurology

## 2017-12-05 DIAGNOSIS — R292 Abnormal reflex: Secondary | ICD-10-CM

## 2017-12-05 DIAGNOSIS — R29898 Other symptoms and signs involving the musculoskeletal system: Secondary | ICD-10-CM

## 2017-12-09 ENCOUNTER — Other Ambulatory Visit: Payer: Self-pay | Admitting: Neurology

## 2017-12-09 DIAGNOSIS — G1229 Other motor neuron disease: Secondary | ICD-10-CM

## 2017-12-09 DIAGNOSIS — G1221 Amyotrophic lateral sclerosis: Secondary | ICD-10-CM

## 2017-12-09 DIAGNOSIS — R258 Other abnormal involuntary movements: Secondary | ICD-10-CM

## 2017-12-15 ENCOUNTER — Telehealth: Payer: Self-pay | Admitting: Neurology

## 2017-12-15 ENCOUNTER — Other Ambulatory Visit (HOSPITAL_COMMUNITY)
Admission: RE | Admit: 2017-12-15 | Discharge: 2017-12-15 | Disposition: A | Discharge status: Home / Self Care | Payer: BLUE CROSS/BLUE SHIELD | Source: Ambulatory Visit | Attending: Neurology | Admitting: Neurology

## 2017-12-15 ENCOUNTER — Ambulatory Visit
Admission: RE | Admit: 2017-12-15 | Discharge: 2017-12-15 | Disposition: A | Discharge status: Home / Self Care | Payer: BLUE CROSS/BLUE SHIELD | Source: Ambulatory Visit | Attending: Neurology | Admitting: Neurology

## 2017-12-15 ENCOUNTER — Ambulatory Visit: Payer: BLUE CROSS/BLUE SHIELD | Admitting: Neurology

## 2017-12-15 VITALS — BP 102/56 | HR 62

## 2017-12-15 DIAGNOSIS — R252 Cramp and spasm: Secondary | ICD-10-CM | POA: Insufficient documentation

## 2017-12-15 DIAGNOSIS — R292 Abnormal reflex: Secondary | ICD-10-CM

## 2017-12-15 DIAGNOSIS — R29898 Other symptoms and signs involving the musculoskeletal system: Secondary | ICD-10-CM | POA: Insufficient documentation

## 2017-12-15 MED ORDER — DIAZEPAM 5 MG PO TABS
10.0000 mg | ORAL_TABLET | Freq: Once | ORAL | Status: AC
  Administered 2017-12-15: 10 mg via ORAL

## 2017-12-15 NOTE — Telephone Encounter (Signed)
Kim/GI [phone] called the pt has LP today at 11:30. She has questions about HTLV 1& 2. Please call asap

## 2017-12-15 NOTE — Telephone Encounter (Signed)
Spoke with Selena Batten @ GI. She stated that she called the lab re: HTLV and they stated that they do not run those labs on CSF. They do have 2 tests:   ~HTLV 1 & 2 Qualitative by PCR, ran off whole blood (actually looks for the virus) ~HTLV 1 & 2 with reflex to confirmatory assay, ran off serum  RN told Selena Batten will d/w Dr. Lucia Gaskins and call her back.

## 2017-12-15 NOTE — Discharge Instructions (Signed)

## 2017-12-15 NOTE — Telephone Encounter (Signed)
Dr. Lucia Gaskins aware and stated she already did those labs. Called GI and spoke with Enrique Sack. Informed her that they can disregard the HTLV lab as it was already drawn. She verbalized understanding and appreciation.

## 2017-12-17 ENCOUNTER — Telehealth: Payer: Self-pay | Admitting: *Deleted

## 2017-12-17 NOTE — Telephone Encounter (Signed)
ERROR

## 2017-12-17 NOTE — Telephone Encounter (Signed)
Spoke with patient and informed her that so far CSF labs are normal. However, still some pending. She verbalized understanding and stated she had an appt 01/20/18 and wondered if she needed to be seen sooner. RN advised to keep March appointment for now pending lab results. Patient will call if she needs [place] in the meantime.

## 2017-12-17 NOTE — Telephone Encounter (Signed)
-----   Message from Anson Fret, MD sent at 12/16/2017 12:12 PM EST ----- So far csf labs are normal, still pending some thanks

## 2017-12-18 LAB — CSF CELL COUNT WITH DIFFERENTIAL
RBC Count, CSF: 1 cells/uL (ref 0–10)
WBC CSF: 0 {cells}/uL (ref 0–5)

## 2017-12-18 LAB — PROTEIN, CSF: TOTAL PROTEIN, CSF: 32 mg/dL (ref 15–45)

## 2017-12-18 LAB — VDRL, CSF: SYPHILIS VDRL QUANT CSF: NONREACTIVE

## 2017-12-18 LAB — GLUCOSE, CSF: Glucose, CSF: 55 mg/dL (ref 40–80)

## 2017-12-19 LAB — CSF CULTURE W GRAM STAIN
Result:: NO GROWTH
SPECIMEN QUALITY:: ADEQUATE

## 2017-12-19 LAB — CSF CULTURE: MICRO NUMBER: 90179756

## 2017-12-23 ENCOUNTER — Encounter: Payer: Self-pay | Admitting: Neurology

## 2017-12-24 DIAGNOSIS — R29898 Other symptoms and signs involving the musculoskeletal system: Secondary | ICD-10-CM | POA: Diagnosis not present

## 2017-12-24 DIAGNOSIS — R258 Other abnormal involuntary movements: Secondary | ICD-10-CM | POA: Diagnosis not present

## 2017-12-24 DIAGNOSIS — R292 Abnormal reflex: Secondary | ICD-10-CM | POA: Diagnosis not present

## 2017-12-24 DIAGNOSIS — R252 Cramp and spasm: Secondary | ICD-10-CM | POA: Diagnosis not present

## 2017-12-29 ENCOUNTER — Encounter: Payer: Self-pay | Admitting: Neurology

## 2017-12-29 ENCOUNTER — Telehealth: Payer: Self-pay | Admitting: Neurology

## 2017-12-29 NOTE — Telephone Encounter (Signed)
Megan Wells or Megan Wells: We referred patient to [facility] They are requesting all our records. I am not sure which one of you would do this but can you make sure all of patient's records, labs, get to address below? See below, this is an email from patient. I have already told her she has to get her MRIs on CD herself and bring them to appointment. thanks   Dr. Lucia Gaskins,    I went to my first visit at [facility] this past Wednesday, February 20, and saw Dr. Aletta Edouard. As it turned out, he had no notes, history or media from my MRI's and lumbar puncture or results from bloodwork or visits. So, we were basically starting from scratch during our consultation.    Dr. Nedra Hai has now referred me to another doctor at [facility] at the ALS clinic, and they need copies of all my records.    Please forward copies of all my medical records, including MRI's, blood tests and lumbar puncture to Dr. Aletta Edouard at [facility] He will make sure my records get in the system so the next doctor will have them when I visit.    Dr. Marigene Ehlers info:  [facility]  [address] [place], [place] [address]  [email or web address]    Thank you,  Megan Wells

## 2017-12-30 ENCOUNTER — Telehealth: Payer: Self-pay | Admitting: Neurology

## 2017-12-30 NOTE — Telephone Encounter (Signed)
Megan Wells or Megan Wells, we referred patient to [facility]. She is going to bring all her imaging with her. She will go to [place] and get the imaging of her brain and neck herself. But we need to mail her the CD of her imaging that she had here at [facility] on our mobile bus unit. Can one of you take care of that please? Please touch base with eachother to make sure one of you can do it, if not let me know. Thank you.

## 2017-12-31 NOTE — Telephone Encounter (Signed)
I talked to patient on 12/30/2017 and relayed to patient that I was sorry about her Medical Records for her [facility] apt. I relayed to patient all her records were sent and I had my previous conformation. Patient understood all details and she was fine with this. Patient relayed that Dr. Nedra Hai Stated the Records may have not went through system  Correctly.   I have faxed all records to Dr. Aletta Edouard telephone [phone] - fax [phone] . I have also sent him a copy via e-mail .   Dr. Ulice Dash info:  [facility]  [address] [place], [place] [address]  [email or web address]    Patient is getting her CD From [place] and Mailing her CD from Here x 2 .  Mailed Patient Records and CD / Conformation. Patient was fine and she was pleased with status.

## 2017-12-31 NOTE — Telephone Encounter (Signed)
See previous telephone call about [facility].

## 2018-01-08 ENCOUNTER — Encounter: Payer: Self-pay | Admitting: Neurology

## 2018-01-13 LAB — FUNGUS CULTURE W SMEAR
MICRO NUMBER: 90179753
SMEAR: NONE SEEN
SPECIMEN QUALITY:: ADEQUATE

## 2018-01-20 ENCOUNTER — Ambulatory Visit: Payer: BLUE CROSS/BLUE SHIELD | Admitting: Neurology

## 2018-02-09 DIAGNOSIS — R252 Cramp and spasm: Secondary | ICD-10-CM | POA: Diagnosis not present

## 2018-02-09 DIAGNOSIS — R269 Unspecified abnormalities of gait and mobility: Secondary | ICD-10-CM | POA: Diagnosis not present

## 2018-02-09 DIAGNOSIS — R292 Abnormal reflex: Secondary | ICD-10-CM | POA: Diagnosis not present

## 2018-02-09 DIAGNOSIS — R296 Repeated falls: Secondary | ICD-10-CM | POA: Diagnosis not present

## 2018-02-10 DIAGNOSIS — R262 Difficulty in walking, not elsewhere classified: Secondary | ICD-10-CM | POA: Diagnosis not present

## 2018-02-10 DIAGNOSIS — R252 Cramp and spasm: Secondary | ICD-10-CM | POA: Diagnosis not present

## 2018-02-10 DIAGNOSIS — R5381 Other malaise: Secondary | ICD-10-CM | POA: Diagnosis not present

## 2018-02-10 DIAGNOSIS — G1221 Amyotrophic lateral sclerosis: Secondary | ICD-10-CM | POA: Diagnosis not present

## 2018-02-10 DIAGNOSIS — R292 Abnormal reflex: Secondary | ICD-10-CM | POA: Diagnosis not present

## 2018-02-24 DIAGNOSIS — F331 Major depressive disorder, recurrent, moderate: Secondary | ICD-10-CM | POA: Diagnosis not present

## 2018-02-24 DIAGNOSIS — F411 Generalized anxiety disorder: Secondary | ICD-10-CM | POA: Diagnosis not present

## 2018-05-12 DIAGNOSIS — R471 Dysarthria and anarthria: Secondary | ICD-10-CM | POA: Diagnosis not present

## 2018-05-12 DIAGNOSIS — G1220 Motor neuron disease, unspecified: Secondary | ICD-10-CM | POA: Diagnosis not present

## 2018-05-12 DIAGNOSIS — G1221 Amyotrophic lateral sclerosis: Secondary | ICD-10-CM | POA: Diagnosis not present

## 2018-05-12 DIAGNOSIS — R5381 Other malaise: Secondary | ICD-10-CM | POA: Diagnosis not present

## 2018-05-12 DIAGNOSIS — R252 Cramp and spasm: Secondary | ICD-10-CM | POA: Diagnosis not present

## 2018-05-12 DIAGNOSIS — R262 Difficulty in walking, not elsewhere classified: Secondary | ICD-10-CM | POA: Diagnosis not present

## 2018-05-14 DIAGNOSIS — F331 Major depressive disorder, recurrent, moderate: Secondary | ICD-10-CM | POA: Diagnosis not present

## 2018-05-14 DIAGNOSIS — F411 Generalized anxiety disorder: Secondary | ICD-10-CM | POA: Diagnosis not present

## 2018-08-11 DIAGNOSIS — G1221 Amyotrophic lateral sclerosis: Secondary | ICD-10-CM | POA: Diagnosis not present

## 2018-08-11 DIAGNOSIS — M6281 Muscle weakness (generalized): Secondary | ICD-10-CM | POA: Diagnosis not present

## 2018-08-11 DIAGNOSIS — R471 Dysarthria and anarthria: Secondary | ICD-10-CM | POA: Diagnosis not present

## 2018-08-11 DIAGNOSIS — R5381 Other malaise: Secondary | ICD-10-CM | POA: Diagnosis not present

## 2018-08-11 DIAGNOSIS — Z79899 Other long term (current) drug therapy: Secondary | ICD-10-CM | POA: Diagnosis not present

## 2018-08-11 DIAGNOSIS — R252 Cramp and spasm: Secondary | ICD-10-CM | POA: Diagnosis not present

## 2018-08-11 DIAGNOSIS — R262 Difficulty in walking, not elsewhere classified: Secondary | ICD-10-CM | POA: Diagnosis not present

## 2018-08-13 DIAGNOSIS — F411 Generalized anxiety disorder: Secondary | ICD-10-CM | POA: Diagnosis not present

## 2018-08-13 DIAGNOSIS — F331 Major depressive disorder, recurrent, moderate: Secondary | ICD-10-CM | POA: Diagnosis not present

## 2018-09-04 DIAGNOSIS — G1221 Amyotrophic lateral sclerosis: Secondary | ICD-10-CM | POA: Diagnosis not present

## 2018-09-14 IMAGING — XA DG FLUORO GUIDE LUMBAR PUNCTURE
1 series · 1 of 1 positions shown · non-contrast
Comparison: none

CLINICAL DATA: Hyper-reflexia and bilateral leg weakness.

[Series 1: ortho standard · 1 of 1 slices shown]
[im 1/1]
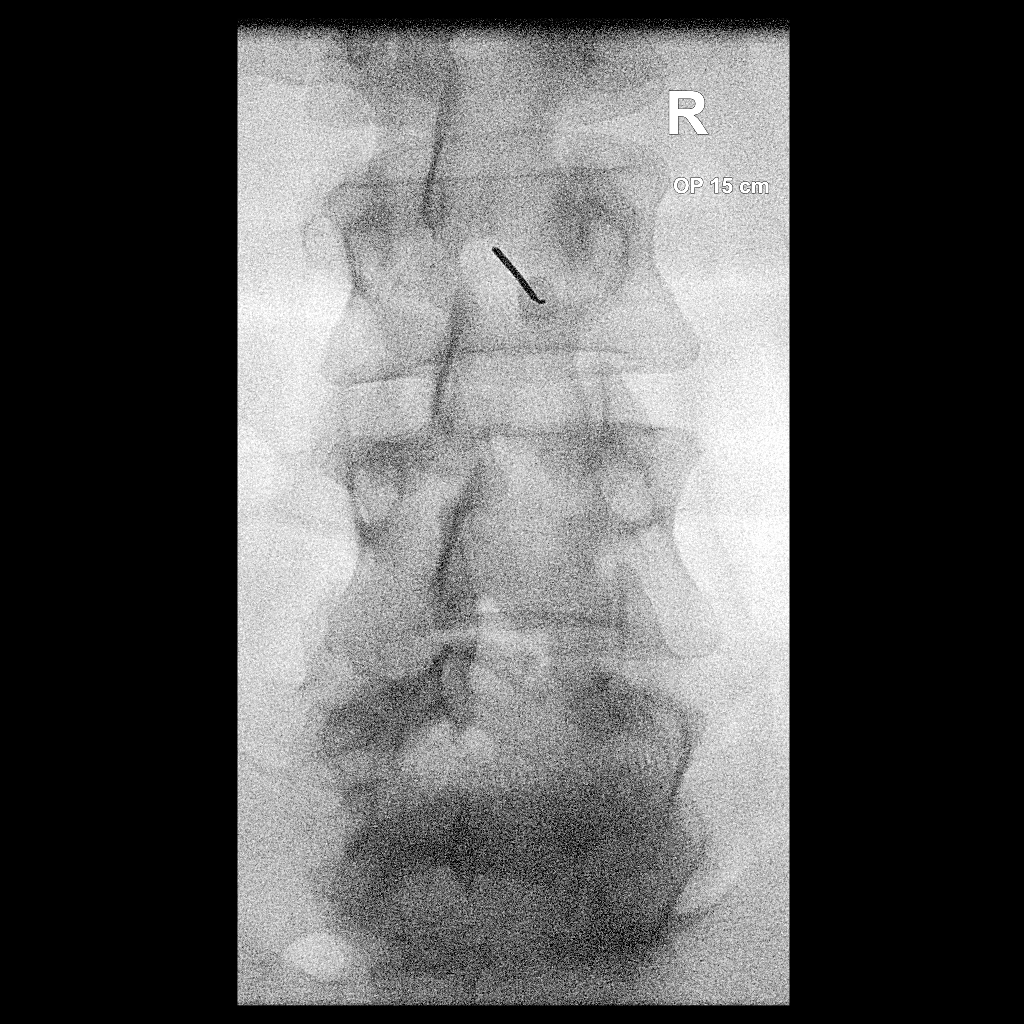

[1 of 1 positions shown; findings below may reference images not displayed]

EXAM:
DIAGNOSTIC LUMBAR PUNCTURE UNDER FLUOROSCOPIC GUIDANCE

FLUOROSCOPY TIME:  Fluoroscopy Time:  Less than 1 second.

Radiation Exposure Index (if provided by the fluoroscopic device):
7.01 ?Gy*m2

Number of Acquired Spot Images: 0

PROCEDURE:
Informed consent was obtained from the patient prior to the
procedure, including potential complications of headache, allergy,
and pain. With the patient prone, the lower back was prepped with
Betadine. 1% Lidocaine was used for local anesthesia. Lumbar
puncture was performed at the L1-L2 level using a 3.5 inch 20 gauge
needle with return of clear, colorless CSF with an opening pressure
of 15 cm water. 11 ml of CSF were obtained for laboratory studies.
The patient tolerated the procedure well and there were no apparent
complications.
IMPRESSION: Technically successful fluoroscopically guided lumbar puncture.

## 2018-09-16 DIAGNOSIS — G1221 Amyotrophic lateral sclerosis: Secondary | ICD-10-CM | POA: Diagnosis not present

## 2018-09-30 DIAGNOSIS — G1221 Amyotrophic lateral sclerosis: Secondary | ICD-10-CM | POA: Diagnosis not present

## 2018-09-30 DIAGNOSIS — S0300XA Dislocation of jaw, unspecified side, initial encounter: Secondary | ICD-10-CM | POA: Diagnosis not present

## 2018-09-30 DIAGNOSIS — F411 Generalized anxiety disorder: Secondary | ICD-10-CM | POA: Diagnosis not present

## 2018-09-30 DIAGNOSIS — F5101 Primary insomnia: Secondary | ICD-10-CM | POA: Diagnosis not present

## 2018-10-10 DIAGNOSIS — F329 Major depressive disorder, single episode, unspecified: Secondary | ICD-10-CM | POA: Diagnosis not present

## 2018-11-02 DIAGNOSIS — E039 Hypothyroidism, unspecified: Secondary | ICD-10-CM | POA: Diagnosis not present

## 2018-11-02 DIAGNOSIS — R634 Abnormal weight loss: Secondary | ICD-10-CM | POA: Diagnosis not present

## 2018-11-02 DIAGNOSIS — I10 Essential (primary) hypertension: Secondary | ICD-10-CM | POA: Diagnosis not present

## 2018-11-17 DIAGNOSIS — R262 Difficulty in walking, not elsewhere classified: Secondary | ICD-10-CM | POA: Diagnosis not present

## 2018-11-17 DIAGNOSIS — Z79899 Other long term (current) drug therapy: Secondary | ICD-10-CM | POA: Diagnosis not present

## 2018-11-17 DIAGNOSIS — R471 Dysarthria and anarthria: Secondary | ICD-10-CM | POA: Diagnosis not present

## 2018-11-17 DIAGNOSIS — G1221 Amyotrophic lateral sclerosis: Secondary | ICD-10-CM | POA: Diagnosis not present

## 2018-11-20 DIAGNOSIS — R300 Dysuria: Secondary | ICD-10-CM | POA: Diagnosis not present

## 2018-11-21 DIAGNOSIS — G1221 Amyotrophic lateral sclerosis: Secondary | ICD-10-CM | POA: Diagnosis not present

## 2018-12-02 DIAGNOSIS — F411 Generalized anxiety disorder: Secondary | ICD-10-CM | POA: Diagnosis not present

## 2018-12-02 DIAGNOSIS — F331 Major depressive disorder, recurrent, moderate: Secondary | ICD-10-CM | POA: Diagnosis not present

## 2019-01-09 DIAGNOSIS — S01511A Laceration without foreign body of lip, initial encounter: Secondary | ICD-10-CM | POA: Diagnosis not present

## 2019-03-02 DIAGNOSIS — F411 Generalized anxiety disorder: Secondary | ICD-10-CM | POA: Diagnosis not present

## 2019-03-02 DIAGNOSIS — F331 Major depressive disorder, recurrent, moderate: Secondary | ICD-10-CM | POA: Diagnosis not present

## 2019-03-09 DIAGNOSIS — G1221 Amyotrophic lateral sclerosis: Secondary | ICD-10-CM | POA: Diagnosis not present

## 2019-05-06 DIAGNOSIS — Z0271 Encounter for disability determination: Secondary | ICD-10-CM

## 2019-11-10 DIAGNOSIS — M7552 Bursitis of left shoulder: Secondary | ICD-10-CM | POA: Diagnosis not present

## 2019-11-10 DIAGNOSIS — G1221 Amyotrophic lateral sclerosis: Secondary | ICD-10-CM | POA: Diagnosis not present

## 2019-11-10 DIAGNOSIS — M545 Low back pain: Secondary | ICD-10-CM | POA: Diagnosis not present

## 2019-12-14 DIAGNOSIS — Z789 Other specified health status: Secondary | ICD-10-CM | POA: Diagnosis not present

## 2019-12-14 DIAGNOSIS — M6281 Muscle weakness (generalized): Secondary | ICD-10-CM | POA: Diagnosis not present

## 2019-12-14 DIAGNOSIS — M25512 Pain in left shoulder: Secondary | ICD-10-CM | POA: Diagnosis not present

## 2019-12-14 DIAGNOSIS — G1221 Amyotrophic lateral sclerosis: Secondary | ICD-10-CM | POA: Diagnosis not present

## 2019-12-14 DIAGNOSIS — M25511 Pain in right shoulder: Secondary | ICD-10-CM | POA: Diagnosis not present

## 2019-12-14 DIAGNOSIS — Z79899 Other long term (current) drug therapy: Secondary | ICD-10-CM | POA: Diagnosis not present

## 2019-12-14 DIAGNOSIS — R471 Dysarthria and anarthria: Secondary | ICD-10-CM | POA: Diagnosis not present

## 2019-12-17 DIAGNOSIS — G1221 Amyotrophic lateral sclerosis: Secondary | ICD-10-CM | POA: Diagnosis not present

## 2020-01-13 DIAGNOSIS — M7501 Adhesive capsulitis of right shoulder: Secondary | ICD-10-CM | POA: Diagnosis not present

## 2020-01-13 DIAGNOSIS — Z79899 Other long term (current) drug therapy: Secondary | ICD-10-CM | POA: Diagnosis not present

## 2020-01-13 DIAGNOSIS — Z9181 History of falling: Secondary | ICD-10-CM | POA: Diagnosis not present

## 2020-01-13 DIAGNOSIS — R69 Illness, unspecified: Secondary | ICD-10-CM | POA: Diagnosis not present

## 2020-01-13 DIAGNOSIS — E039 Hypothyroidism, unspecified: Secondary | ICD-10-CM | POA: Diagnosis not present

## 2020-01-13 DIAGNOSIS — G1221 Amyotrophic lateral sclerosis: Secondary | ICD-10-CM | POA: Diagnosis not present

## 2020-01-13 DIAGNOSIS — I10 Essential (primary) hypertension: Secondary | ICD-10-CM | POA: Diagnosis not present

## 2020-01-13 DIAGNOSIS — M7502 Adhesive capsulitis of left shoulder: Secondary | ICD-10-CM | POA: Diagnosis not present

## 2020-01-14 DIAGNOSIS — G1221 Amyotrophic lateral sclerosis: Secondary | ICD-10-CM | POA: Diagnosis not present

## 2020-01-18 DIAGNOSIS — Z9181 History of falling: Secondary | ICD-10-CM | POA: Diagnosis not present

## 2020-01-18 DIAGNOSIS — G1221 Amyotrophic lateral sclerosis: Secondary | ICD-10-CM | POA: Diagnosis not present

## 2020-01-18 DIAGNOSIS — Z79899 Other long term (current) drug therapy: Secondary | ICD-10-CM | POA: Diagnosis not present

## 2020-01-18 DIAGNOSIS — M7501 Adhesive capsulitis of right shoulder: Secondary | ICD-10-CM | POA: Diagnosis not present

## 2020-01-18 DIAGNOSIS — E039 Hypothyroidism, unspecified: Secondary | ICD-10-CM | POA: Diagnosis not present

## 2020-01-18 DIAGNOSIS — M7502 Adhesive capsulitis of left shoulder: Secondary | ICD-10-CM | POA: Diagnosis not present

## 2020-01-18 DIAGNOSIS — I10 Essential (primary) hypertension: Secondary | ICD-10-CM | POA: Diagnosis not present

## 2020-01-18 DIAGNOSIS — R69 Illness, unspecified: Secondary | ICD-10-CM | POA: Diagnosis not present

## 2020-01-19 DIAGNOSIS — M7501 Adhesive capsulitis of right shoulder: Secondary | ICD-10-CM | POA: Diagnosis not present

## 2020-01-19 DIAGNOSIS — M7502 Adhesive capsulitis of left shoulder: Secondary | ICD-10-CM | POA: Diagnosis not present

## 2020-01-19 DIAGNOSIS — Z79899 Other long term (current) drug therapy: Secondary | ICD-10-CM | POA: Diagnosis not present

## 2020-01-19 DIAGNOSIS — G1221 Amyotrophic lateral sclerosis: Secondary | ICD-10-CM | POA: Diagnosis not present

## 2020-01-19 DIAGNOSIS — Z9181 History of falling: Secondary | ICD-10-CM | POA: Diagnosis not present

## 2020-01-19 DIAGNOSIS — E039 Hypothyroidism, unspecified: Secondary | ICD-10-CM | POA: Diagnosis not present

## 2020-01-19 DIAGNOSIS — I10 Essential (primary) hypertension: Secondary | ICD-10-CM | POA: Diagnosis not present

## 2020-01-19 DIAGNOSIS — R69 Illness, unspecified: Secondary | ICD-10-CM | POA: Diagnosis not present

## 2020-01-20 DIAGNOSIS — M7501 Adhesive capsulitis of right shoulder: Secondary | ICD-10-CM | POA: Diagnosis not present

## 2020-01-20 DIAGNOSIS — Z79899 Other long term (current) drug therapy: Secondary | ICD-10-CM | POA: Diagnosis not present

## 2020-01-20 DIAGNOSIS — Z9181 History of falling: Secondary | ICD-10-CM | POA: Diagnosis not present

## 2020-01-20 DIAGNOSIS — E039 Hypothyroidism, unspecified: Secondary | ICD-10-CM | POA: Diagnosis not present

## 2020-01-20 DIAGNOSIS — M7502 Adhesive capsulitis of left shoulder: Secondary | ICD-10-CM | POA: Diagnosis not present

## 2020-01-20 DIAGNOSIS — R69 Illness, unspecified: Secondary | ICD-10-CM | POA: Diagnosis not present

## 2020-01-20 DIAGNOSIS — I10 Essential (primary) hypertension: Secondary | ICD-10-CM | POA: Diagnosis not present

## 2020-01-20 DIAGNOSIS — G1221 Amyotrophic lateral sclerosis: Secondary | ICD-10-CM | POA: Diagnosis not present

## 2020-01-21 DIAGNOSIS — R69 Illness, unspecified: Secondary | ICD-10-CM | POA: Diagnosis not present

## 2020-01-21 DIAGNOSIS — M7502 Adhesive capsulitis of left shoulder: Secondary | ICD-10-CM | POA: Diagnosis not present

## 2020-01-21 DIAGNOSIS — Z9181 History of falling: Secondary | ICD-10-CM | POA: Diagnosis not present

## 2020-01-21 DIAGNOSIS — I10 Essential (primary) hypertension: Secondary | ICD-10-CM | POA: Diagnosis not present

## 2020-01-21 DIAGNOSIS — E039 Hypothyroidism, unspecified: Secondary | ICD-10-CM | POA: Diagnosis not present

## 2020-01-21 DIAGNOSIS — M7501 Adhesive capsulitis of right shoulder: Secondary | ICD-10-CM | POA: Diagnosis not present

## 2020-01-21 DIAGNOSIS — G1221 Amyotrophic lateral sclerosis: Secondary | ICD-10-CM | POA: Diagnosis not present

## 2020-01-21 DIAGNOSIS — Z79899 Other long term (current) drug therapy: Secondary | ICD-10-CM | POA: Diagnosis not present

## 2020-01-24 DIAGNOSIS — I10 Essential (primary) hypertension: Secondary | ICD-10-CM | POA: Diagnosis not present

## 2020-01-24 DIAGNOSIS — M7501 Adhesive capsulitis of right shoulder: Secondary | ICD-10-CM | POA: Diagnosis not present

## 2020-01-24 DIAGNOSIS — Z9181 History of falling: Secondary | ICD-10-CM | POA: Diagnosis not present

## 2020-01-24 DIAGNOSIS — R69 Illness, unspecified: Secondary | ICD-10-CM | POA: Diagnosis not present

## 2020-01-24 DIAGNOSIS — G1221 Amyotrophic lateral sclerosis: Secondary | ICD-10-CM | POA: Diagnosis not present

## 2020-01-24 DIAGNOSIS — M7502 Adhesive capsulitis of left shoulder: Secondary | ICD-10-CM | POA: Diagnosis not present

## 2020-01-24 DIAGNOSIS — Z79899 Other long term (current) drug therapy: Secondary | ICD-10-CM | POA: Diagnosis not present

## 2020-01-24 DIAGNOSIS — E039 Hypothyroidism, unspecified: Secondary | ICD-10-CM | POA: Diagnosis not present

## 2020-01-25 DIAGNOSIS — M7502 Adhesive capsulitis of left shoulder: Secondary | ICD-10-CM | POA: Diagnosis not present

## 2020-01-25 DIAGNOSIS — R69 Illness, unspecified: Secondary | ICD-10-CM | POA: Diagnosis not present

## 2020-01-25 DIAGNOSIS — Z79899 Other long term (current) drug therapy: Secondary | ICD-10-CM | POA: Diagnosis not present

## 2020-01-25 DIAGNOSIS — Z9181 History of falling: Secondary | ICD-10-CM | POA: Diagnosis not present

## 2020-01-25 DIAGNOSIS — E039 Hypothyroidism, unspecified: Secondary | ICD-10-CM | POA: Diagnosis not present

## 2020-01-25 DIAGNOSIS — G1221 Amyotrophic lateral sclerosis: Secondary | ICD-10-CM | POA: Diagnosis not present

## 2020-01-25 DIAGNOSIS — I10 Essential (primary) hypertension: Secondary | ICD-10-CM | POA: Diagnosis not present

## 2020-01-25 DIAGNOSIS — M7501 Adhesive capsulitis of right shoulder: Secondary | ICD-10-CM | POA: Diagnosis not present

## 2020-01-26 DIAGNOSIS — M7501 Adhesive capsulitis of right shoulder: Secondary | ICD-10-CM | POA: Diagnosis not present

## 2020-01-26 DIAGNOSIS — Z79899 Other long term (current) drug therapy: Secondary | ICD-10-CM | POA: Diagnosis not present

## 2020-01-26 DIAGNOSIS — I10 Essential (primary) hypertension: Secondary | ICD-10-CM | POA: Diagnosis not present

## 2020-01-26 DIAGNOSIS — E039 Hypothyroidism, unspecified: Secondary | ICD-10-CM | POA: Diagnosis not present

## 2020-01-26 DIAGNOSIS — M7502 Adhesive capsulitis of left shoulder: Secondary | ICD-10-CM | POA: Diagnosis not present

## 2020-01-26 DIAGNOSIS — Z9181 History of falling: Secondary | ICD-10-CM | POA: Diagnosis not present

## 2020-01-26 DIAGNOSIS — G1221 Amyotrophic lateral sclerosis: Secondary | ICD-10-CM | POA: Diagnosis not present

## 2020-01-26 DIAGNOSIS — R69 Illness, unspecified: Secondary | ICD-10-CM | POA: Diagnosis not present

## 2020-01-27 DIAGNOSIS — G1221 Amyotrophic lateral sclerosis: Secondary | ICD-10-CM | POA: Diagnosis not present

## 2020-01-27 DIAGNOSIS — R69 Illness, unspecified: Secondary | ICD-10-CM | POA: Diagnosis not present

## 2020-01-27 DIAGNOSIS — M7502 Adhesive capsulitis of left shoulder: Secondary | ICD-10-CM | POA: Diagnosis not present

## 2020-01-27 DIAGNOSIS — Z79899 Other long term (current) drug therapy: Secondary | ICD-10-CM | POA: Diagnosis not present

## 2020-01-27 DIAGNOSIS — E039 Hypothyroidism, unspecified: Secondary | ICD-10-CM | POA: Diagnosis not present

## 2020-01-27 DIAGNOSIS — I10 Essential (primary) hypertension: Secondary | ICD-10-CM | POA: Diagnosis not present

## 2020-01-27 DIAGNOSIS — Z9181 History of falling: Secondary | ICD-10-CM | POA: Diagnosis not present

## 2020-01-27 DIAGNOSIS — M7501 Adhesive capsulitis of right shoulder: Secondary | ICD-10-CM | POA: Diagnosis not present

## 2020-01-31 DIAGNOSIS — Z9181 History of falling: Secondary | ICD-10-CM | POA: Diagnosis not present

## 2020-01-31 DIAGNOSIS — M7502 Adhesive capsulitis of left shoulder: Secondary | ICD-10-CM | POA: Diagnosis not present

## 2020-01-31 DIAGNOSIS — I10 Essential (primary) hypertension: Secondary | ICD-10-CM | POA: Diagnosis not present

## 2020-01-31 DIAGNOSIS — E039 Hypothyroidism, unspecified: Secondary | ICD-10-CM | POA: Diagnosis not present

## 2020-01-31 DIAGNOSIS — Z79899 Other long term (current) drug therapy: Secondary | ICD-10-CM | POA: Diagnosis not present

## 2020-01-31 DIAGNOSIS — R69 Illness, unspecified: Secondary | ICD-10-CM | POA: Diagnosis not present

## 2020-01-31 DIAGNOSIS — G1221 Amyotrophic lateral sclerosis: Secondary | ICD-10-CM | POA: Diagnosis not present

## 2020-01-31 DIAGNOSIS — M7501 Adhesive capsulitis of right shoulder: Secondary | ICD-10-CM | POA: Diagnosis not present

## 2020-02-01 DIAGNOSIS — E039 Hypothyroidism, unspecified: Secondary | ICD-10-CM | POA: Diagnosis not present

## 2020-02-01 DIAGNOSIS — G1221 Amyotrophic lateral sclerosis: Secondary | ICD-10-CM | POA: Diagnosis not present

## 2020-02-01 DIAGNOSIS — I10 Essential (primary) hypertension: Secondary | ICD-10-CM | POA: Diagnosis not present

## 2020-02-01 DIAGNOSIS — R69 Illness, unspecified: Secondary | ICD-10-CM | POA: Diagnosis not present

## 2020-02-01 DIAGNOSIS — M7501 Adhesive capsulitis of right shoulder: Secondary | ICD-10-CM | POA: Diagnosis not present

## 2020-02-01 DIAGNOSIS — Z9181 History of falling: Secondary | ICD-10-CM | POA: Diagnosis not present

## 2020-02-01 DIAGNOSIS — Z79899 Other long term (current) drug therapy: Secondary | ICD-10-CM | POA: Diagnosis not present

## 2020-02-01 DIAGNOSIS — M7502 Adhesive capsulitis of left shoulder: Secondary | ICD-10-CM | POA: Diagnosis not present

## 2020-02-03 DIAGNOSIS — I10 Essential (primary) hypertension: Secondary | ICD-10-CM | POA: Diagnosis not present

## 2020-02-03 DIAGNOSIS — G1221 Amyotrophic lateral sclerosis: Secondary | ICD-10-CM | POA: Diagnosis not present

## 2020-02-03 DIAGNOSIS — M7501 Adhesive capsulitis of right shoulder: Secondary | ICD-10-CM | POA: Diagnosis not present

## 2020-02-03 DIAGNOSIS — M7502 Adhesive capsulitis of left shoulder: Secondary | ICD-10-CM | POA: Diagnosis not present

## 2020-02-03 DIAGNOSIS — Z9181 History of falling: Secondary | ICD-10-CM | POA: Diagnosis not present

## 2020-02-03 DIAGNOSIS — Z79899 Other long term (current) drug therapy: Secondary | ICD-10-CM | POA: Diagnosis not present

## 2020-02-03 DIAGNOSIS — R69 Illness, unspecified: Secondary | ICD-10-CM | POA: Diagnosis not present

## 2020-02-03 DIAGNOSIS — E039 Hypothyroidism, unspecified: Secondary | ICD-10-CM | POA: Diagnosis not present

## 2020-02-08 DIAGNOSIS — R69 Illness, unspecified: Secondary | ICD-10-CM | POA: Diagnosis not present

## 2020-02-08 DIAGNOSIS — I10 Essential (primary) hypertension: Secondary | ICD-10-CM | POA: Diagnosis not present

## 2020-02-08 DIAGNOSIS — Z9181 History of falling: Secondary | ICD-10-CM | POA: Diagnosis not present

## 2020-02-08 DIAGNOSIS — G1221 Amyotrophic lateral sclerosis: Secondary | ICD-10-CM | POA: Diagnosis not present

## 2020-02-08 DIAGNOSIS — Z79899 Other long term (current) drug therapy: Secondary | ICD-10-CM | POA: Diagnosis not present

## 2020-02-08 DIAGNOSIS — E039 Hypothyroidism, unspecified: Secondary | ICD-10-CM | POA: Diagnosis not present

## 2020-02-08 DIAGNOSIS — M7502 Adhesive capsulitis of left shoulder: Secondary | ICD-10-CM | POA: Diagnosis not present

## 2020-02-08 DIAGNOSIS — M7501 Adhesive capsulitis of right shoulder: Secondary | ICD-10-CM | POA: Diagnosis not present

## 2020-02-09 DIAGNOSIS — G1221 Amyotrophic lateral sclerosis: Secondary | ICD-10-CM | POA: Diagnosis not present

## 2020-02-09 DIAGNOSIS — E039 Hypothyroidism, unspecified: Secondary | ICD-10-CM | POA: Diagnosis not present

## 2020-02-09 DIAGNOSIS — M7501 Adhesive capsulitis of right shoulder: Secondary | ICD-10-CM | POA: Diagnosis not present

## 2020-02-09 DIAGNOSIS — Z79899 Other long term (current) drug therapy: Secondary | ICD-10-CM | POA: Diagnosis not present

## 2020-02-09 DIAGNOSIS — M7502 Adhesive capsulitis of left shoulder: Secondary | ICD-10-CM | POA: Diagnosis not present

## 2020-02-09 DIAGNOSIS — I10 Essential (primary) hypertension: Secondary | ICD-10-CM | POA: Diagnosis not present

## 2020-02-09 DIAGNOSIS — Z9181 History of falling: Secondary | ICD-10-CM | POA: Diagnosis not present

## 2020-02-09 DIAGNOSIS — R69 Illness, unspecified: Secondary | ICD-10-CM | POA: Diagnosis not present

## 2020-02-10 DIAGNOSIS — Z9181 History of falling: Secondary | ICD-10-CM | POA: Diagnosis not present

## 2020-02-10 DIAGNOSIS — M7502 Adhesive capsulitis of left shoulder: Secondary | ICD-10-CM | POA: Diagnosis not present

## 2020-02-10 DIAGNOSIS — R69 Illness, unspecified: Secondary | ICD-10-CM | POA: Diagnosis not present

## 2020-02-10 DIAGNOSIS — G1221 Amyotrophic lateral sclerosis: Secondary | ICD-10-CM | POA: Diagnosis not present

## 2020-02-10 DIAGNOSIS — M7501 Adhesive capsulitis of right shoulder: Secondary | ICD-10-CM | POA: Diagnosis not present

## 2020-02-10 DIAGNOSIS — Z79899 Other long term (current) drug therapy: Secondary | ICD-10-CM | POA: Diagnosis not present

## 2020-02-10 DIAGNOSIS — I10 Essential (primary) hypertension: Secondary | ICD-10-CM | POA: Diagnosis not present

## 2020-02-10 DIAGNOSIS — E039 Hypothyroidism, unspecified: Secondary | ICD-10-CM | POA: Diagnosis not present

## 2020-02-11 DIAGNOSIS — M7502 Adhesive capsulitis of left shoulder: Secondary | ICD-10-CM | POA: Diagnosis not present

## 2020-02-11 DIAGNOSIS — Z9181 History of falling: Secondary | ICD-10-CM | POA: Diagnosis not present

## 2020-02-11 DIAGNOSIS — R69 Illness, unspecified: Secondary | ICD-10-CM | POA: Diagnosis not present

## 2020-02-11 DIAGNOSIS — G1221 Amyotrophic lateral sclerosis: Secondary | ICD-10-CM | POA: Diagnosis not present

## 2020-02-11 DIAGNOSIS — E039 Hypothyroidism, unspecified: Secondary | ICD-10-CM | POA: Diagnosis not present

## 2020-02-11 DIAGNOSIS — M7501 Adhesive capsulitis of right shoulder: Secondary | ICD-10-CM | POA: Diagnosis not present

## 2020-02-11 DIAGNOSIS — I10 Essential (primary) hypertension: Secondary | ICD-10-CM | POA: Diagnosis not present

## 2020-02-11 DIAGNOSIS — Z79899 Other long term (current) drug therapy: Secondary | ICD-10-CM | POA: Diagnosis not present

## 2020-02-14 DIAGNOSIS — I10 Essential (primary) hypertension: Secondary | ICD-10-CM | POA: Diagnosis not present

## 2020-02-14 DIAGNOSIS — G1221 Amyotrophic lateral sclerosis: Secondary | ICD-10-CM | POA: Diagnosis not present

## 2020-02-14 DIAGNOSIS — Z9181 History of falling: Secondary | ICD-10-CM | POA: Diagnosis not present

## 2020-02-14 DIAGNOSIS — E039 Hypothyroidism, unspecified: Secondary | ICD-10-CM | POA: Diagnosis not present

## 2020-02-14 DIAGNOSIS — M7501 Adhesive capsulitis of right shoulder: Secondary | ICD-10-CM | POA: Diagnosis not present

## 2020-02-14 DIAGNOSIS — R69 Illness, unspecified: Secondary | ICD-10-CM | POA: Diagnosis not present

## 2020-02-14 DIAGNOSIS — Z79899 Other long term (current) drug therapy: Secondary | ICD-10-CM | POA: Diagnosis not present

## 2020-02-14 DIAGNOSIS — M7502 Adhesive capsulitis of left shoulder: Secondary | ICD-10-CM | POA: Diagnosis not present

## 2020-02-17 DIAGNOSIS — Z79899 Other long term (current) drug therapy: Secondary | ICD-10-CM | POA: Diagnosis not present

## 2020-02-17 DIAGNOSIS — G1221 Amyotrophic lateral sclerosis: Secondary | ICD-10-CM | POA: Diagnosis not present

## 2020-02-17 DIAGNOSIS — Z9181 History of falling: Secondary | ICD-10-CM | POA: Diagnosis not present

## 2020-02-17 DIAGNOSIS — M7502 Adhesive capsulitis of left shoulder: Secondary | ICD-10-CM | POA: Diagnosis not present

## 2020-02-17 DIAGNOSIS — R69 Illness, unspecified: Secondary | ICD-10-CM | POA: Diagnosis not present

## 2020-02-17 DIAGNOSIS — M7501 Adhesive capsulitis of right shoulder: Secondary | ICD-10-CM | POA: Diagnosis not present

## 2020-02-17 DIAGNOSIS — I10 Essential (primary) hypertension: Secondary | ICD-10-CM | POA: Diagnosis not present

## 2020-02-17 DIAGNOSIS — E039 Hypothyroidism, unspecified: Secondary | ICD-10-CM | POA: Diagnosis not present

## 2020-02-24 DIAGNOSIS — Z23 Encounter for immunization: Secondary | ICD-10-CM | POA: Diagnosis not present

## 2020-03-14 DIAGNOSIS — J069 Acute upper respiratory infection, unspecified: Secondary | ICD-10-CM | POA: Diagnosis not present

## 2020-03-14 DIAGNOSIS — Z20828 Contact with and (suspected) exposure to other viral communicable diseases: Secondary | ICD-10-CM | POA: Diagnosis not present

## 2020-03-15 DIAGNOSIS — G1221 Amyotrophic lateral sclerosis: Secondary | ICD-10-CM | POA: Diagnosis not present

## 2020-03-21 DIAGNOSIS — E860 Dehydration: Secondary | ICD-10-CM | POA: Diagnosis not present

## 2020-03-21 DIAGNOSIS — M6281 Muscle weakness (generalized): Secondary | ICD-10-CM | POA: Diagnosis not present

## 2020-03-21 DIAGNOSIS — N3941 Urge incontinence: Secondary | ICD-10-CM | POA: Diagnosis not present

## 2020-03-21 DIAGNOSIS — G1221 Amyotrophic lateral sclerosis: Secondary | ICD-10-CM | POA: Diagnosis not present

## 2020-03-21 DIAGNOSIS — R258 Other abnormal involuntary movements: Secondary | ICD-10-CM | POA: Diagnosis not present

## 2020-03-23 DIAGNOSIS — Z23 Encounter for immunization: Secondary | ICD-10-CM | POA: Diagnosis not present

## 2020-03-28 DIAGNOSIS — I10 Essential (primary) hypertension: Secondary | ICD-10-CM | POA: Diagnosis not present

## 2020-03-28 DIAGNOSIS — E039 Hypothyroidism, unspecified: Secondary | ICD-10-CM | POA: Diagnosis not present

## 2020-03-28 DIAGNOSIS — Z993 Dependence on wheelchair: Secondary | ICD-10-CM | POA: Diagnosis not present

## 2020-03-28 DIAGNOSIS — R69 Illness, unspecified: Secondary | ICD-10-CM | POA: Diagnosis not present

## 2020-03-28 DIAGNOSIS — G1221 Amyotrophic lateral sclerosis: Secondary | ICD-10-CM | POA: Diagnosis not present

## 2020-03-30 DIAGNOSIS — R69 Illness, unspecified: Secondary | ICD-10-CM | POA: Diagnosis not present

## 2020-03-30 DIAGNOSIS — G1221 Amyotrophic lateral sclerosis: Secondary | ICD-10-CM | POA: Diagnosis not present

## 2020-03-30 DIAGNOSIS — E039 Hypothyroidism, unspecified: Secondary | ICD-10-CM | POA: Diagnosis not present

## 2020-03-30 DIAGNOSIS — Z993 Dependence on wheelchair: Secondary | ICD-10-CM | POA: Diagnosis not present

## 2020-03-30 DIAGNOSIS — I10 Essential (primary) hypertension: Secondary | ICD-10-CM | POA: Diagnosis not present

## 2020-04-05 DIAGNOSIS — G1221 Amyotrophic lateral sclerosis: Secondary | ICD-10-CM | POA: Diagnosis not present

## 2020-04-05 DIAGNOSIS — Z993 Dependence on wheelchair: Secondary | ICD-10-CM | POA: Diagnosis not present

## 2020-04-05 DIAGNOSIS — R69 Illness, unspecified: Secondary | ICD-10-CM | POA: Diagnosis not present

## 2020-04-05 DIAGNOSIS — E039 Hypothyroidism, unspecified: Secondary | ICD-10-CM | POA: Diagnosis not present

## 2020-04-05 DIAGNOSIS — I10 Essential (primary) hypertension: Secondary | ICD-10-CM | POA: Diagnosis not present

## 2020-04-07 DIAGNOSIS — R69 Illness, unspecified: Secondary | ICD-10-CM | POA: Diagnosis not present

## 2020-04-07 DIAGNOSIS — G1221 Amyotrophic lateral sclerosis: Secondary | ICD-10-CM | POA: Diagnosis not present

## 2020-04-07 DIAGNOSIS — I10 Essential (primary) hypertension: Secondary | ICD-10-CM | POA: Diagnosis not present

## 2020-04-07 DIAGNOSIS — Z993 Dependence on wheelchair: Secondary | ICD-10-CM | POA: Diagnosis not present

## 2020-04-07 DIAGNOSIS — E039 Hypothyroidism, unspecified: Secondary | ICD-10-CM | POA: Diagnosis not present

## 2020-04-11 DIAGNOSIS — R69 Illness, unspecified: Secondary | ICD-10-CM | POA: Diagnosis not present

## 2020-04-11 DIAGNOSIS — E039 Hypothyroidism, unspecified: Secondary | ICD-10-CM | POA: Diagnosis not present

## 2020-04-11 DIAGNOSIS — G1221 Amyotrophic lateral sclerosis: Secondary | ICD-10-CM | POA: Diagnosis not present

## 2020-04-11 DIAGNOSIS — I10 Essential (primary) hypertension: Secondary | ICD-10-CM | POA: Diagnosis not present

## 2020-04-11 DIAGNOSIS — Z993 Dependence on wheelchair: Secondary | ICD-10-CM | POA: Diagnosis not present

## 2020-04-12 DIAGNOSIS — R69 Illness, unspecified: Secondary | ICD-10-CM | POA: Diagnosis not present

## 2020-04-12 DIAGNOSIS — Z993 Dependence on wheelchair: Secondary | ICD-10-CM | POA: Diagnosis not present

## 2020-04-12 DIAGNOSIS — I10 Essential (primary) hypertension: Secondary | ICD-10-CM | POA: Diagnosis not present

## 2020-04-12 DIAGNOSIS — E039 Hypothyroidism, unspecified: Secondary | ICD-10-CM | POA: Diagnosis not present

## 2020-04-12 DIAGNOSIS — G1221 Amyotrophic lateral sclerosis: Secondary | ICD-10-CM | POA: Diagnosis not present

## 2020-04-15 DIAGNOSIS — G1221 Amyotrophic lateral sclerosis: Secondary | ICD-10-CM | POA: Diagnosis not present

## 2020-04-17 DIAGNOSIS — I10 Essential (primary) hypertension: Secondary | ICD-10-CM | POA: Diagnosis not present

## 2020-04-17 DIAGNOSIS — E039 Hypothyroidism, unspecified: Secondary | ICD-10-CM | POA: Diagnosis not present

## 2020-04-17 DIAGNOSIS — R69 Illness, unspecified: Secondary | ICD-10-CM | POA: Diagnosis not present

## 2020-04-17 DIAGNOSIS — G1221 Amyotrophic lateral sclerosis: Secondary | ICD-10-CM | POA: Diagnosis not present

## 2020-04-17 DIAGNOSIS — Z993 Dependence on wheelchair: Secondary | ICD-10-CM | POA: Diagnosis not present

## 2020-04-18 DIAGNOSIS — I10 Essential (primary) hypertension: Secondary | ICD-10-CM | POA: Diagnosis not present

## 2020-04-18 DIAGNOSIS — Z993 Dependence on wheelchair: Secondary | ICD-10-CM | POA: Diagnosis not present

## 2020-04-18 DIAGNOSIS — G1221 Amyotrophic lateral sclerosis: Secondary | ICD-10-CM | POA: Diagnosis not present

## 2020-04-18 DIAGNOSIS — R69 Illness, unspecified: Secondary | ICD-10-CM | POA: Diagnosis not present

## 2020-04-18 DIAGNOSIS — E039 Hypothyroidism, unspecified: Secondary | ICD-10-CM | POA: Diagnosis not present

## 2020-04-21 DIAGNOSIS — G1221 Amyotrophic lateral sclerosis: Secondary | ICD-10-CM | POA: Diagnosis not present

## 2020-04-21 DIAGNOSIS — E039 Hypothyroidism, unspecified: Secondary | ICD-10-CM | POA: Diagnosis not present

## 2020-04-21 DIAGNOSIS — I10 Essential (primary) hypertension: Secondary | ICD-10-CM | POA: Diagnosis not present

## 2020-04-21 DIAGNOSIS — Z993 Dependence on wheelchair: Secondary | ICD-10-CM | POA: Diagnosis not present

## 2020-04-21 DIAGNOSIS — R69 Illness, unspecified: Secondary | ICD-10-CM | POA: Diagnosis not present

## 2020-04-24 DIAGNOSIS — G1221 Amyotrophic lateral sclerosis: Secondary | ICD-10-CM | POA: Diagnosis not present

## 2020-04-24 DIAGNOSIS — R69 Illness, unspecified: Secondary | ICD-10-CM | POA: Diagnosis not present

## 2020-04-24 DIAGNOSIS — I10 Essential (primary) hypertension: Secondary | ICD-10-CM | POA: Diagnosis not present

## 2020-04-24 DIAGNOSIS — Z993 Dependence on wheelchair: Secondary | ICD-10-CM | POA: Diagnosis not present

## 2020-04-24 DIAGNOSIS — E039 Hypothyroidism, unspecified: Secondary | ICD-10-CM | POA: Diagnosis not present

## 2020-04-25 DIAGNOSIS — R69 Illness, unspecified: Secondary | ICD-10-CM | POA: Diagnosis not present

## 2020-04-25 DIAGNOSIS — E039 Hypothyroidism, unspecified: Secondary | ICD-10-CM | POA: Diagnosis not present

## 2020-04-25 DIAGNOSIS — G1221 Amyotrophic lateral sclerosis: Secondary | ICD-10-CM | POA: Diagnosis not present

## 2020-04-25 DIAGNOSIS — I10 Essential (primary) hypertension: Secondary | ICD-10-CM | POA: Diagnosis not present

## 2020-04-25 DIAGNOSIS — Z993 Dependence on wheelchair: Secondary | ICD-10-CM | POA: Diagnosis not present

## 2020-04-26 DIAGNOSIS — Z993 Dependence on wheelchair: Secondary | ICD-10-CM | POA: Diagnosis not present

## 2020-04-26 DIAGNOSIS — I10 Essential (primary) hypertension: Secondary | ICD-10-CM | POA: Diagnosis not present

## 2020-04-26 DIAGNOSIS — G1221 Amyotrophic lateral sclerosis: Secondary | ICD-10-CM | POA: Diagnosis not present

## 2020-04-26 DIAGNOSIS — R69 Illness, unspecified: Secondary | ICD-10-CM | POA: Diagnosis not present

## 2020-04-26 DIAGNOSIS — E039 Hypothyroidism, unspecified: Secondary | ICD-10-CM | POA: Diagnosis not present

## 2020-04-27 DIAGNOSIS — I10 Essential (primary) hypertension: Secondary | ICD-10-CM | POA: Diagnosis not present

## 2020-04-27 DIAGNOSIS — R69 Illness, unspecified: Secondary | ICD-10-CM | POA: Diagnosis not present

## 2020-04-27 DIAGNOSIS — E039 Hypothyroidism, unspecified: Secondary | ICD-10-CM | POA: Diagnosis not present

## 2020-04-27 DIAGNOSIS — G1221 Amyotrophic lateral sclerosis: Secondary | ICD-10-CM | POA: Diagnosis not present

## 2020-04-27 DIAGNOSIS — Z993 Dependence on wheelchair: Secondary | ICD-10-CM | POA: Diagnosis not present

## 2020-05-01 DIAGNOSIS — Z993 Dependence on wheelchair: Secondary | ICD-10-CM | POA: Diagnosis not present

## 2020-05-01 DIAGNOSIS — I10 Essential (primary) hypertension: Secondary | ICD-10-CM | POA: Diagnosis not present

## 2020-05-01 DIAGNOSIS — R69 Illness, unspecified: Secondary | ICD-10-CM | POA: Diagnosis not present

## 2020-05-01 DIAGNOSIS — E039 Hypothyroidism, unspecified: Secondary | ICD-10-CM | POA: Diagnosis not present

## 2020-05-01 DIAGNOSIS — G1221 Amyotrophic lateral sclerosis: Secondary | ICD-10-CM | POA: Diagnosis not present

## 2020-05-04 DIAGNOSIS — Z993 Dependence on wheelchair: Secondary | ICD-10-CM | POA: Diagnosis not present

## 2020-05-04 DIAGNOSIS — E039 Hypothyroidism, unspecified: Secondary | ICD-10-CM | POA: Diagnosis not present

## 2020-05-04 DIAGNOSIS — G1221 Amyotrophic lateral sclerosis: Secondary | ICD-10-CM | POA: Diagnosis not present

## 2020-05-04 DIAGNOSIS — R69 Illness, unspecified: Secondary | ICD-10-CM | POA: Diagnosis not present

## 2020-05-04 DIAGNOSIS — I10 Essential (primary) hypertension: Secondary | ICD-10-CM | POA: Diagnosis not present

## 2020-05-15 DIAGNOSIS — G1221 Amyotrophic lateral sclerosis: Secondary | ICD-10-CM | POA: Diagnosis not present

## 2020-05-18 DIAGNOSIS — R69 Illness, unspecified: Secondary | ICD-10-CM | POA: Diagnosis not present

## 2020-06-15 DIAGNOSIS — G1221 Amyotrophic lateral sclerosis: Secondary | ICD-10-CM | POA: Diagnosis not present

## 2020-06-19 DIAGNOSIS — Z0100 Encounter for examination of eyes and vision without abnormal findings: Secondary | ICD-10-CM | POA: Diagnosis not present

## 2020-06-19 DIAGNOSIS — H16223 Keratoconjunctivitis sicca, not specified as Sjogren's, bilateral: Secondary | ICD-10-CM | POA: Diagnosis not present

## 2020-06-19 DIAGNOSIS — H5210 Myopia, unspecified eye: Secondary | ICD-10-CM | POA: Diagnosis not present

## 2020-06-20 DIAGNOSIS — G1221 Amyotrophic lateral sclerosis: Secondary | ICD-10-CM | POA: Diagnosis not present

## 2020-06-20 DIAGNOSIS — M25519 Pain in unspecified shoulder: Secondary | ICD-10-CM | POA: Diagnosis not present

## 2020-06-20 DIAGNOSIS — R471 Dysarthria and anarthria: Secondary | ICD-10-CM | POA: Diagnosis not present

## 2020-06-20 DIAGNOSIS — R252 Cramp and spasm: Secondary | ICD-10-CM | POA: Diagnosis not present

## 2020-06-20 DIAGNOSIS — Z789 Other specified health status: Secondary | ICD-10-CM | POA: Diagnosis not present

## 2020-06-20 DIAGNOSIS — M6281 Muscle weakness (generalized): Secondary | ICD-10-CM | POA: Diagnosis not present

## 2020-07-16 DIAGNOSIS — G1221 Amyotrophic lateral sclerosis: Secondary | ICD-10-CM | POA: Diagnosis not present

## 2020-08-03 DIAGNOSIS — E039 Hypothyroidism, unspecified: Secondary | ICD-10-CM | POA: Diagnosis not present

## 2020-08-03 DIAGNOSIS — R69 Illness, unspecified: Secondary | ICD-10-CM | POA: Diagnosis not present

## 2020-08-03 DIAGNOSIS — G1221 Amyotrophic lateral sclerosis: Secondary | ICD-10-CM | POA: Diagnosis not present

## 2020-08-03 DIAGNOSIS — I10 Essential (primary) hypertension: Secondary | ICD-10-CM | POA: Diagnosis not present

## 2020-08-03 DIAGNOSIS — M25512 Pain in left shoulder: Secondary | ICD-10-CM | POA: Diagnosis not present

## 2020-08-04 DIAGNOSIS — M25512 Pain in left shoulder: Secondary | ICD-10-CM | POA: Diagnosis not present

## 2020-08-04 DIAGNOSIS — I10 Essential (primary) hypertension: Secondary | ICD-10-CM | POA: Diagnosis not present

## 2020-08-04 DIAGNOSIS — R69 Illness, unspecified: Secondary | ICD-10-CM | POA: Diagnosis not present

## 2020-08-04 DIAGNOSIS — E039 Hypothyroidism, unspecified: Secondary | ICD-10-CM | POA: Diagnosis not present

## 2020-08-04 DIAGNOSIS — G1221 Amyotrophic lateral sclerosis: Secondary | ICD-10-CM | POA: Diagnosis not present

## 2020-08-07 DIAGNOSIS — G1221 Amyotrophic lateral sclerosis: Secondary | ICD-10-CM | POA: Diagnosis not present

## 2020-08-07 DIAGNOSIS — M25512 Pain in left shoulder: Secondary | ICD-10-CM | POA: Diagnosis not present

## 2020-08-07 DIAGNOSIS — I10 Essential (primary) hypertension: Secondary | ICD-10-CM | POA: Diagnosis not present

## 2020-08-07 DIAGNOSIS — R69 Illness, unspecified: Secondary | ICD-10-CM | POA: Diagnosis not present

## 2020-08-07 DIAGNOSIS — E039 Hypothyroidism, unspecified: Secondary | ICD-10-CM | POA: Diagnosis not present

## 2020-08-10 DIAGNOSIS — M25512 Pain in left shoulder: Secondary | ICD-10-CM | POA: Diagnosis not present

## 2020-08-10 DIAGNOSIS — E039 Hypothyroidism, unspecified: Secondary | ICD-10-CM | POA: Diagnosis not present

## 2020-08-10 DIAGNOSIS — R69 Illness, unspecified: Secondary | ICD-10-CM | POA: Diagnosis not present

## 2020-08-10 DIAGNOSIS — I10 Essential (primary) hypertension: Secondary | ICD-10-CM | POA: Diagnosis not present

## 2020-08-10 DIAGNOSIS — G1221 Amyotrophic lateral sclerosis: Secondary | ICD-10-CM | POA: Diagnosis not present

## 2020-08-14 DIAGNOSIS — M25512 Pain in left shoulder: Secondary | ICD-10-CM | POA: Diagnosis not present

## 2020-08-14 DIAGNOSIS — G1221 Amyotrophic lateral sclerosis: Secondary | ICD-10-CM | POA: Diagnosis not present

## 2020-08-14 DIAGNOSIS — R69 Illness, unspecified: Secondary | ICD-10-CM | POA: Diagnosis not present

## 2020-08-14 DIAGNOSIS — I10 Essential (primary) hypertension: Secondary | ICD-10-CM | POA: Diagnosis not present

## 2020-08-14 DIAGNOSIS — E039 Hypothyroidism, unspecified: Secondary | ICD-10-CM | POA: Diagnosis not present

## 2020-08-15 DIAGNOSIS — G1221 Amyotrophic lateral sclerosis: Secondary | ICD-10-CM | POA: Diagnosis not present

## 2020-08-17 DIAGNOSIS — I10 Essential (primary) hypertension: Secondary | ICD-10-CM | POA: Diagnosis not present

## 2020-08-17 DIAGNOSIS — G1221 Amyotrophic lateral sclerosis: Secondary | ICD-10-CM | POA: Diagnosis not present

## 2020-08-17 DIAGNOSIS — E039 Hypothyroidism, unspecified: Secondary | ICD-10-CM | POA: Diagnosis not present

## 2020-08-17 DIAGNOSIS — R69 Illness, unspecified: Secondary | ICD-10-CM | POA: Diagnosis not present

## 2020-08-17 DIAGNOSIS — M25512 Pain in left shoulder: Secondary | ICD-10-CM | POA: Diagnosis not present

## 2020-08-21 DIAGNOSIS — G1221 Amyotrophic lateral sclerosis: Secondary | ICD-10-CM | POA: Diagnosis not present

## 2020-08-21 DIAGNOSIS — R69 Illness, unspecified: Secondary | ICD-10-CM | POA: Diagnosis not present

## 2020-08-21 DIAGNOSIS — M25512 Pain in left shoulder: Secondary | ICD-10-CM | POA: Diagnosis not present

## 2020-08-21 DIAGNOSIS — E039 Hypothyroidism, unspecified: Secondary | ICD-10-CM | POA: Diagnosis not present

## 2020-08-21 DIAGNOSIS — I10 Essential (primary) hypertension: Secondary | ICD-10-CM | POA: Diagnosis not present

## 2020-08-22 DIAGNOSIS — E039 Hypothyroidism, unspecified: Secondary | ICD-10-CM | POA: Diagnosis not present

## 2020-08-22 DIAGNOSIS — M25512 Pain in left shoulder: Secondary | ICD-10-CM | POA: Diagnosis not present

## 2020-08-22 DIAGNOSIS — R69 Illness, unspecified: Secondary | ICD-10-CM | POA: Diagnosis not present

## 2020-08-22 DIAGNOSIS — I10 Essential (primary) hypertension: Secondary | ICD-10-CM | POA: Diagnosis not present

## 2020-08-22 DIAGNOSIS — G1221 Amyotrophic lateral sclerosis: Secondary | ICD-10-CM | POA: Diagnosis not present

## 2020-08-24 DIAGNOSIS — R69 Illness, unspecified: Secondary | ICD-10-CM | POA: Diagnosis not present

## 2020-08-24 DIAGNOSIS — I10 Essential (primary) hypertension: Secondary | ICD-10-CM | POA: Diagnosis not present

## 2020-08-24 DIAGNOSIS — G1221 Amyotrophic lateral sclerosis: Secondary | ICD-10-CM | POA: Diagnosis not present

## 2020-08-24 DIAGNOSIS — E039 Hypothyroidism, unspecified: Secondary | ICD-10-CM | POA: Diagnosis not present

## 2020-08-24 DIAGNOSIS — M25512 Pain in left shoulder: Secondary | ICD-10-CM | POA: Diagnosis not present

## 2020-08-28 DIAGNOSIS — I10 Essential (primary) hypertension: Secondary | ICD-10-CM | POA: Diagnosis not present

## 2020-08-28 DIAGNOSIS — G1221 Amyotrophic lateral sclerosis: Secondary | ICD-10-CM | POA: Diagnosis not present

## 2020-08-28 DIAGNOSIS — M25512 Pain in left shoulder: Secondary | ICD-10-CM | POA: Diagnosis not present

## 2020-08-28 DIAGNOSIS — E039 Hypothyroidism, unspecified: Secondary | ICD-10-CM | POA: Diagnosis not present

## 2020-08-28 DIAGNOSIS — R69 Illness, unspecified: Secondary | ICD-10-CM | POA: Diagnosis not present

## 2020-08-30 DIAGNOSIS — E039 Hypothyroidism, unspecified: Secondary | ICD-10-CM | POA: Diagnosis not present

## 2020-08-30 DIAGNOSIS — G1221 Amyotrophic lateral sclerosis: Secondary | ICD-10-CM | POA: Diagnosis not present

## 2020-08-30 DIAGNOSIS — M25512 Pain in left shoulder: Secondary | ICD-10-CM | POA: Diagnosis not present

## 2020-08-30 DIAGNOSIS — I10 Essential (primary) hypertension: Secondary | ICD-10-CM | POA: Diagnosis not present

## 2020-08-30 DIAGNOSIS — R69 Illness, unspecified: Secondary | ICD-10-CM | POA: Diagnosis not present

## 2020-08-31 DIAGNOSIS — I10 Essential (primary) hypertension: Secondary | ICD-10-CM | POA: Diagnosis not present

## 2020-08-31 DIAGNOSIS — E039 Hypothyroidism, unspecified: Secondary | ICD-10-CM | POA: Diagnosis not present

## 2020-08-31 DIAGNOSIS — R69 Illness, unspecified: Secondary | ICD-10-CM | POA: Diagnosis not present

## 2020-08-31 DIAGNOSIS — M25512 Pain in left shoulder: Secondary | ICD-10-CM | POA: Diagnosis not present

## 2020-08-31 DIAGNOSIS — G1221 Amyotrophic lateral sclerosis: Secondary | ICD-10-CM | POA: Diagnosis not present

## 2020-09-05 DIAGNOSIS — M25512 Pain in left shoulder: Secondary | ICD-10-CM | POA: Diagnosis not present

## 2020-09-05 DIAGNOSIS — I10 Essential (primary) hypertension: Secondary | ICD-10-CM | POA: Diagnosis not present

## 2020-09-05 DIAGNOSIS — E039 Hypothyroidism, unspecified: Secondary | ICD-10-CM | POA: Diagnosis not present

## 2020-09-05 DIAGNOSIS — R69 Illness, unspecified: Secondary | ICD-10-CM | POA: Diagnosis not present

## 2020-09-05 DIAGNOSIS — G1221 Amyotrophic lateral sclerosis: Secondary | ICD-10-CM | POA: Diagnosis not present

## 2020-09-07 DIAGNOSIS — M25512 Pain in left shoulder: Secondary | ICD-10-CM | POA: Diagnosis not present

## 2020-09-07 DIAGNOSIS — I10 Essential (primary) hypertension: Secondary | ICD-10-CM | POA: Diagnosis not present

## 2020-09-07 DIAGNOSIS — E039 Hypothyroidism, unspecified: Secondary | ICD-10-CM | POA: Diagnosis not present

## 2020-09-07 DIAGNOSIS — G1221 Amyotrophic lateral sclerosis: Secondary | ICD-10-CM | POA: Diagnosis not present

## 2020-09-07 DIAGNOSIS — R69 Illness, unspecified: Secondary | ICD-10-CM | POA: Diagnosis not present

## 2020-09-12 DIAGNOSIS — I10 Essential (primary) hypertension: Secondary | ICD-10-CM | POA: Diagnosis not present

## 2020-09-12 DIAGNOSIS — G1221 Amyotrophic lateral sclerosis: Secondary | ICD-10-CM | POA: Diagnosis not present

## 2020-09-12 DIAGNOSIS — E039 Hypothyroidism, unspecified: Secondary | ICD-10-CM | POA: Diagnosis not present

## 2020-09-12 DIAGNOSIS — R69 Illness, unspecified: Secondary | ICD-10-CM | POA: Diagnosis not present

## 2020-09-12 DIAGNOSIS — M25512 Pain in left shoulder: Secondary | ICD-10-CM | POA: Diagnosis not present

## 2020-09-13 DIAGNOSIS — E039 Hypothyroidism, unspecified: Secondary | ICD-10-CM | POA: Diagnosis not present

## 2020-09-13 DIAGNOSIS — M25512 Pain in left shoulder: Secondary | ICD-10-CM | POA: Diagnosis not present

## 2020-09-13 DIAGNOSIS — R69 Illness, unspecified: Secondary | ICD-10-CM | POA: Diagnosis not present

## 2020-09-13 DIAGNOSIS — G1221 Amyotrophic lateral sclerosis: Secondary | ICD-10-CM | POA: Diagnosis not present

## 2020-09-13 DIAGNOSIS — I10 Essential (primary) hypertension: Secondary | ICD-10-CM | POA: Diagnosis not present

## 2020-09-15 DIAGNOSIS — G1221 Amyotrophic lateral sclerosis: Secondary | ICD-10-CM | POA: Diagnosis not present

## 2020-09-22 DIAGNOSIS — I10 Essential (primary) hypertension: Secondary | ICD-10-CM | POA: Diagnosis not present

## 2020-09-22 DIAGNOSIS — G1221 Amyotrophic lateral sclerosis: Secondary | ICD-10-CM | POA: Diagnosis not present

## 2020-09-22 DIAGNOSIS — R69 Illness, unspecified: Secondary | ICD-10-CM | POA: Diagnosis not present

## 2020-09-22 DIAGNOSIS — E039 Hypothyroidism, unspecified: Secondary | ICD-10-CM | POA: Diagnosis not present

## 2020-09-22 DIAGNOSIS — M25512 Pain in left shoulder: Secondary | ICD-10-CM | POA: Diagnosis not present

## 2020-09-26 DIAGNOSIS — R252 Cramp and spasm: Secondary | ICD-10-CM | POA: Diagnosis not present

## 2020-09-26 DIAGNOSIS — G1221 Amyotrophic lateral sclerosis: Secondary | ICD-10-CM | POA: Diagnosis not present

## 2020-09-26 DIAGNOSIS — R471 Dysarthria and anarthria: Secondary | ICD-10-CM | POA: Diagnosis not present

## 2020-09-26 DIAGNOSIS — R300 Dysuria: Secondary | ICD-10-CM | POA: Diagnosis not present

## 2020-09-26 DIAGNOSIS — Z79899 Other long term (current) drug therapy: Secondary | ICD-10-CM | POA: Diagnosis not present

## 2020-09-29 DIAGNOSIS — R69 Illness, unspecified: Secondary | ICD-10-CM | POA: Diagnosis not present

## 2020-09-29 DIAGNOSIS — M25512 Pain in left shoulder: Secondary | ICD-10-CM | POA: Diagnosis not present

## 2020-09-29 DIAGNOSIS — G1221 Amyotrophic lateral sclerosis: Secondary | ICD-10-CM | POA: Diagnosis not present

## 2020-09-29 DIAGNOSIS — E039 Hypothyroidism, unspecified: Secondary | ICD-10-CM | POA: Diagnosis not present

## 2020-09-29 DIAGNOSIS — I10 Essential (primary) hypertension: Secondary | ICD-10-CM | POA: Diagnosis not present

## 2020-10-03 DIAGNOSIS — R252 Cramp and spasm: Secondary | ICD-10-CM | POA: Diagnosis not present

## 2020-10-03 DIAGNOSIS — B373 Candidiasis of vulva and vagina: Secondary | ICD-10-CM | POA: Diagnosis not present

## 2020-10-03 DIAGNOSIS — I10 Essential (primary) hypertension: Secondary | ICD-10-CM | POA: Diagnosis not present

## 2020-10-03 DIAGNOSIS — Z23 Encounter for immunization: Secondary | ICD-10-CM | POA: Diagnosis not present

## 2020-10-03 DIAGNOSIS — E039 Hypothyroidism, unspecified: Secondary | ICD-10-CM | POA: Diagnosis not present

## 2020-10-03 DIAGNOSIS — B001 Herpesviral vesicular dermatitis: Secondary | ICD-10-CM | POA: Diagnosis not present

## 2020-10-19 DIAGNOSIS — R69 Illness, unspecified: Secondary | ICD-10-CM | POA: Diagnosis not present

## 2020-11-09 DIAGNOSIS — R69 Illness, unspecified: Secondary | ICD-10-CM | POA: Diagnosis not present

## 2020-11-10 DIAGNOSIS — Z20828 Contact with and (suspected) exposure to other viral communicable diseases: Secondary | ICD-10-CM | POA: Diagnosis not present

## 2020-11-14 DIAGNOSIS — Z20822 Contact with and (suspected) exposure to covid-19: Secondary | ICD-10-CM | POA: Diagnosis not present

## 2020-11-14 DIAGNOSIS — Z20828 Contact with and (suspected) exposure to other viral communicable diseases: Secondary | ICD-10-CM | POA: Diagnosis not present

## 2020-11-23 DIAGNOSIS — M24575 Contracture, left foot: Secondary | ICD-10-CM | POA: Diagnosis not present

## 2020-11-23 DIAGNOSIS — G1221 Amyotrophic lateral sclerosis: Secondary | ICD-10-CM | POA: Diagnosis not present

## 2020-12-01 DIAGNOSIS — M24572 Contracture, left ankle: Secondary | ICD-10-CM | POA: Diagnosis not present

## 2020-12-01 DIAGNOSIS — M24575 Contracture, left foot: Secondary | ICD-10-CM | POA: Diagnosis not present

## 2020-12-01 DIAGNOSIS — E039 Hypothyroidism, unspecified: Secondary | ICD-10-CM | POA: Diagnosis not present

## 2020-12-01 DIAGNOSIS — R69 Illness, unspecified: Secondary | ICD-10-CM | POA: Diagnosis not present

## 2020-12-01 DIAGNOSIS — Z86010 Personal history of colonic polyps: Secondary | ICD-10-CM | POA: Diagnosis not present

## 2020-12-01 DIAGNOSIS — G1221 Amyotrophic lateral sclerosis: Secondary | ICD-10-CM | POA: Diagnosis not present

## 2020-12-01 DIAGNOSIS — Z993 Dependence on wheelchair: Secondary | ICD-10-CM | POA: Diagnosis not present

## 2020-12-05 DIAGNOSIS — M6281 Muscle weakness (generalized): Secondary | ICD-10-CM | POA: Diagnosis not present

## 2020-12-05 DIAGNOSIS — R252 Cramp and spasm: Secondary | ICD-10-CM | POA: Diagnosis not present

## 2020-12-05 DIAGNOSIS — R471 Dysarthria and anarthria: Secondary | ICD-10-CM | POA: Diagnosis not present

## 2020-12-05 DIAGNOSIS — R1312 Dysphagia, oropharyngeal phase: Secondary | ICD-10-CM | POA: Diagnosis not present

## 2020-12-05 DIAGNOSIS — G1221 Amyotrophic lateral sclerosis: Secondary | ICD-10-CM | POA: Diagnosis not present

## 2020-12-06 DIAGNOSIS — Z86010 Personal history of colonic polyps: Secondary | ICD-10-CM | POA: Diagnosis not present

## 2020-12-06 DIAGNOSIS — M24572 Contracture, left ankle: Secondary | ICD-10-CM | POA: Diagnosis not present

## 2020-12-06 DIAGNOSIS — Z993 Dependence on wheelchair: Secondary | ICD-10-CM | POA: Diagnosis not present

## 2020-12-06 DIAGNOSIS — E039 Hypothyroidism, unspecified: Secondary | ICD-10-CM | POA: Diagnosis not present

## 2020-12-06 DIAGNOSIS — R69 Illness, unspecified: Secondary | ICD-10-CM | POA: Diagnosis not present

## 2020-12-06 DIAGNOSIS — G1221 Amyotrophic lateral sclerosis: Secondary | ICD-10-CM | POA: Diagnosis not present

## 2020-12-06 DIAGNOSIS — M24575 Contracture, left foot: Secondary | ICD-10-CM | POA: Diagnosis not present

## 2020-12-08 DIAGNOSIS — M6702 Short Achilles tendon (acquired), left ankle: Secondary | ICD-10-CM | POA: Diagnosis not present

## 2020-12-08 DIAGNOSIS — M21372 Foot drop, left foot: Secondary | ICD-10-CM | POA: Diagnosis not present

## 2020-12-08 DIAGNOSIS — M25372 Other instability, left ankle: Secondary | ICD-10-CM | POA: Diagnosis not present

## 2020-12-08 DIAGNOSIS — G1221 Amyotrophic lateral sclerosis: Secondary | ICD-10-CM

## 2020-12-08 HISTORY — DX: Amyotrophic lateral sclerosis: G12.21

## 2020-12-11 DIAGNOSIS — Z993 Dependence on wheelchair: Secondary | ICD-10-CM | POA: Diagnosis not present

## 2020-12-11 DIAGNOSIS — M24575 Contracture, left foot: Secondary | ICD-10-CM | POA: Diagnosis not present

## 2020-12-11 DIAGNOSIS — G1221 Amyotrophic lateral sclerosis: Secondary | ICD-10-CM | POA: Diagnosis not present

## 2020-12-11 DIAGNOSIS — E039 Hypothyroidism, unspecified: Secondary | ICD-10-CM | POA: Diagnosis not present

## 2020-12-11 DIAGNOSIS — R69 Illness, unspecified: Secondary | ICD-10-CM | POA: Diagnosis not present

## 2020-12-11 DIAGNOSIS — Z86010 Personal history of colonic polyps: Secondary | ICD-10-CM | POA: Diagnosis not present

## 2020-12-11 DIAGNOSIS — M24572 Contracture, left ankle: Secondary | ICD-10-CM | POA: Diagnosis not present

## 2020-12-13 DIAGNOSIS — R69 Illness, unspecified: Secondary | ICD-10-CM | POA: Diagnosis not present

## 2020-12-13 DIAGNOSIS — E039 Hypothyroidism, unspecified: Secondary | ICD-10-CM | POA: Diagnosis not present

## 2020-12-13 DIAGNOSIS — G1221 Amyotrophic lateral sclerosis: Secondary | ICD-10-CM | POA: Diagnosis not present

## 2020-12-13 DIAGNOSIS — Z86010 Personal history of colonic polyps: Secondary | ICD-10-CM | POA: Diagnosis not present

## 2020-12-13 DIAGNOSIS — M24575 Contracture, left foot: Secondary | ICD-10-CM | POA: Diagnosis not present

## 2020-12-13 DIAGNOSIS — Z993 Dependence on wheelchair: Secondary | ICD-10-CM | POA: Diagnosis not present

## 2020-12-13 DIAGNOSIS — H6982 Other specified disorders of Eustachian tube, left ear: Secondary | ICD-10-CM | POA: Diagnosis not present

## 2020-12-13 DIAGNOSIS — M24572 Contracture, left ankle: Secondary | ICD-10-CM | POA: Diagnosis not present

## 2020-12-14 DIAGNOSIS — G1221 Amyotrophic lateral sclerosis: Secondary | ICD-10-CM | POA: Diagnosis not present

## 2020-12-14 DIAGNOSIS — M24572 Contracture, left ankle: Secondary | ICD-10-CM | POA: Diagnosis not present

## 2020-12-14 DIAGNOSIS — R69 Illness, unspecified: Secondary | ICD-10-CM | POA: Diagnosis not present

## 2020-12-14 DIAGNOSIS — Z993 Dependence on wheelchair: Secondary | ICD-10-CM | POA: Diagnosis not present

## 2020-12-14 DIAGNOSIS — Z86010 Personal history of colonic polyps: Secondary | ICD-10-CM | POA: Diagnosis not present

## 2020-12-14 DIAGNOSIS — M24575 Contracture, left foot: Secondary | ICD-10-CM | POA: Diagnosis not present

## 2020-12-14 DIAGNOSIS — E039 Hypothyroidism, unspecified: Secondary | ICD-10-CM | POA: Diagnosis not present

## 2020-12-18 DIAGNOSIS — R69 Illness, unspecified: Secondary | ICD-10-CM | POA: Diagnosis not present

## 2020-12-19 DIAGNOSIS — M24572 Contracture, left ankle: Secondary | ICD-10-CM | POA: Diagnosis not present

## 2020-12-19 DIAGNOSIS — E039 Hypothyroidism, unspecified: Secondary | ICD-10-CM | POA: Diagnosis not present

## 2020-12-19 DIAGNOSIS — Z993 Dependence on wheelchair: Secondary | ICD-10-CM | POA: Diagnosis not present

## 2020-12-19 DIAGNOSIS — Z86010 Personal history of colonic polyps: Secondary | ICD-10-CM | POA: Diagnosis not present

## 2020-12-19 DIAGNOSIS — G1221 Amyotrophic lateral sclerosis: Secondary | ICD-10-CM | POA: Diagnosis not present

## 2020-12-19 DIAGNOSIS — R69 Illness, unspecified: Secondary | ICD-10-CM | POA: Diagnosis not present

## 2020-12-19 DIAGNOSIS — M24575 Contracture, left foot: Secondary | ICD-10-CM | POA: Diagnosis not present

## 2020-12-20 DIAGNOSIS — M24572 Contracture, left ankle: Secondary | ICD-10-CM | POA: Diagnosis not present

## 2020-12-20 DIAGNOSIS — Z993 Dependence on wheelchair: Secondary | ICD-10-CM | POA: Diagnosis not present

## 2020-12-20 DIAGNOSIS — G1221 Amyotrophic lateral sclerosis: Secondary | ICD-10-CM | POA: Diagnosis not present

## 2020-12-20 DIAGNOSIS — E039 Hypothyroidism, unspecified: Secondary | ICD-10-CM | POA: Diagnosis not present

## 2020-12-20 DIAGNOSIS — M24575 Contracture, left foot: Secondary | ICD-10-CM | POA: Diagnosis not present

## 2020-12-20 DIAGNOSIS — Z86010 Personal history of colonic polyps: Secondary | ICD-10-CM | POA: Diagnosis not present

## 2020-12-20 DIAGNOSIS — R69 Illness, unspecified: Secondary | ICD-10-CM | POA: Diagnosis not present

## 2020-12-21 DIAGNOSIS — E039 Hypothyroidism, unspecified: Secondary | ICD-10-CM | POA: Diagnosis not present

## 2020-12-21 DIAGNOSIS — M24572 Contracture, left ankle: Secondary | ICD-10-CM | POA: Diagnosis not present

## 2020-12-21 DIAGNOSIS — Z993 Dependence on wheelchair: Secondary | ICD-10-CM | POA: Diagnosis not present

## 2020-12-21 DIAGNOSIS — R69 Illness, unspecified: Secondary | ICD-10-CM | POA: Diagnosis not present

## 2020-12-21 DIAGNOSIS — M24575 Contracture, left foot: Secondary | ICD-10-CM | POA: Diagnosis not present

## 2020-12-21 DIAGNOSIS — G1221 Amyotrophic lateral sclerosis: Secondary | ICD-10-CM | POA: Diagnosis not present

## 2020-12-21 DIAGNOSIS — Z86010 Personal history of colonic polyps: Secondary | ICD-10-CM | POA: Diagnosis not present

## 2020-12-22 ENCOUNTER — Other Ambulatory Visit: Payer: Self-pay

## 2020-12-22 DIAGNOSIS — I10 Essential (primary) hypertension: Secondary | ICD-10-CM | POA: Insufficient documentation

## 2020-12-22 DIAGNOSIS — F32A Depression, unspecified: Secondary | ICD-10-CM | POA: Insufficient documentation

## 2020-12-22 DIAGNOSIS — E039 Hypothyroidism, unspecified: Secondary | ICD-10-CM | POA: Insufficient documentation

## 2020-12-25 DIAGNOSIS — M24572 Contracture, left ankle: Secondary | ICD-10-CM | POA: Diagnosis not present

## 2020-12-25 DIAGNOSIS — E039 Hypothyroidism, unspecified: Secondary | ICD-10-CM | POA: Diagnosis not present

## 2020-12-25 DIAGNOSIS — G1221 Amyotrophic lateral sclerosis: Secondary | ICD-10-CM | POA: Diagnosis not present

## 2020-12-25 DIAGNOSIS — Z86010 Personal history of colonic polyps: Secondary | ICD-10-CM | POA: Diagnosis not present

## 2020-12-25 DIAGNOSIS — M24575 Contracture, left foot: Secondary | ICD-10-CM | POA: Diagnosis not present

## 2020-12-25 DIAGNOSIS — R69 Illness, unspecified: Secondary | ICD-10-CM | POA: Diagnosis not present

## 2020-12-25 DIAGNOSIS — Z993 Dependence on wheelchair: Secondary | ICD-10-CM | POA: Diagnosis not present

## 2020-12-27 DIAGNOSIS — E039 Hypothyroidism, unspecified: Secondary | ICD-10-CM | POA: Diagnosis not present

## 2020-12-27 DIAGNOSIS — M24572 Contracture, left ankle: Secondary | ICD-10-CM | POA: Diagnosis not present

## 2020-12-27 DIAGNOSIS — R69 Illness, unspecified: Secondary | ICD-10-CM | POA: Diagnosis not present

## 2020-12-27 DIAGNOSIS — G1221 Amyotrophic lateral sclerosis: Secondary | ICD-10-CM | POA: Diagnosis not present

## 2020-12-27 DIAGNOSIS — Z993 Dependence on wheelchair: Secondary | ICD-10-CM | POA: Diagnosis not present

## 2020-12-27 DIAGNOSIS — Z86010 Personal history of colonic polyps: Secondary | ICD-10-CM | POA: Diagnosis not present

## 2020-12-27 DIAGNOSIS — M24575 Contracture, left foot: Secondary | ICD-10-CM | POA: Diagnosis not present

## 2021-01-01 DIAGNOSIS — R69 Illness, unspecified: Secondary | ICD-10-CM | POA: Diagnosis not present

## 2021-01-01 DIAGNOSIS — Z993 Dependence on wheelchair: Secondary | ICD-10-CM | POA: Diagnosis not present

## 2021-01-01 DIAGNOSIS — M24575 Contracture, left foot: Secondary | ICD-10-CM | POA: Diagnosis not present

## 2021-01-01 DIAGNOSIS — G1221 Amyotrophic lateral sclerosis: Secondary | ICD-10-CM | POA: Diagnosis not present

## 2021-01-01 DIAGNOSIS — Z86010 Personal history of colonic polyps: Secondary | ICD-10-CM | POA: Diagnosis not present

## 2021-01-01 DIAGNOSIS — I10 Essential (primary) hypertension: Secondary | ICD-10-CM | POA: Diagnosis not present

## 2021-01-01 DIAGNOSIS — M24572 Contracture, left ankle: Secondary | ICD-10-CM | POA: Diagnosis not present

## 2021-01-01 DIAGNOSIS — E039 Hypothyroidism, unspecified: Secondary | ICD-10-CM | POA: Diagnosis not present

## 2021-01-03 DIAGNOSIS — Z993 Dependence on wheelchair: Secondary | ICD-10-CM | POA: Diagnosis not present

## 2021-01-03 DIAGNOSIS — Z86010 Personal history of colonic polyps: Secondary | ICD-10-CM | POA: Diagnosis not present

## 2021-01-03 DIAGNOSIS — R69 Illness, unspecified: Secondary | ICD-10-CM | POA: Diagnosis not present

## 2021-01-03 DIAGNOSIS — M24575 Contracture, left foot: Secondary | ICD-10-CM | POA: Diagnosis not present

## 2021-01-03 DIAGNOSIS — E039 Hypothyroidism, unspecified: Secondary | ICD-10-CM | POA: Diagnosis not present

## 2021-01-03 DIAGNOSIS — M24572 Contracture, left ankle: Secondary | ICD-10-CM | POA: Diagnosis not present

## 2021-01-03 DIAGNOSIS — G1221 Amyotrophic lateral sclerosis: Secondary | ICD-10-CM | POA: Diagnosis not present

## 2021-01-04 DIAGNOSIS — G1221 Amyotrophic lateral sclerosis: Secondary | ICD-10-CM | POA: Diagnosis not present

## 2021-01-08 DIAGNOSIS — M21372 Foot drop, left foot: Secondary | ICD-10-CM | POA: Diagnosis not present

## 2021-01-08 DIAGNOSIS — M216X2 Other acquired deformities of left foot: Secondary | ICD-10-CM | POA: Diagnosis not present

## 2021-01-09 DIAGNOSIS — G1221 Amyotrophic lateral sclerosis: Secondary | ICD-10-CM | POA: Diagnosis not present

## 2021-01-09 DIAGNOSIS — M24575 Contracture, left foot: Secondary | ICD-10-CM | POA: Diagnosis not present

## 2021-01-09 DIAGNOSIS — R69 Illness, unspecified: Secondary | ICD-10-CM | POA: Diagnosis not present

## 2021-01-09 DIAGNOSIS — Z993 Dependence on wheelchair: Secondary | ICD-10-CM | POA: Diagnosis not present

## 2021-01-09 DIAGNOSIS — E039 Hypothyroidism, unspecified: Secondary | ICD-10-CM | POA: Diagnosis not present

## 2021-01-09 DIAGNOSIS — M24572 Contracture, left ankle: Secondary | ICD-10-CM | POA: Diagnosis not present

## 2021-01-09 DIAGNOSIS — Z86010 Personal history of colonic polyps: Secondary | ICD-10-CM | POA: Diagnosis not present

## 2021-01-10 DIAGNOSIS — R69 Illness, unspecified: Secondary | ICD-10-CM | POA: Diagnosis not present

## 2021-01-10 DIAGNOSIS — E039 Hypothyroidism, unspecified: Secondary | ICD-10-CM | POA: Diagnosis not present

## 2021-01-10 DIAGNOSIS — Z993 Dependence on wheelchair: Secondary | ICD-10-CM | POA: Diagnosis not present

## 2021-01-10 DIAGNOSIS — M24575 Contracture, left foot: Secondary | ICD-10-CM | POA: Diagnosis not present

## 2021-01-10 DIAGNOSIS — G1221 Amyotrophic lateral sclerosis: Secondary | ICD-10-CM | POA: Diagnosis not present

## 2021-01-10 DIAGNOSIS — M24572 Contracture, left ankle: Secondary | ICD-10-CM | POA: Diagnosis not present

## 2021-01-10 DIAGNOSIS — Z86010 Personal history of colonic polyps: Secondary | ICD-10-CM | POA: Diagnosis not present

## 2021-01-11 DIAGNOSIS — G1221 Amyotrophic lateral sclerosis: Secondary | ICD-10-CM | POA: Diagnosis not present

## 2021-01-11 DIAGNOSIS — Z0289 Encounter for other administrative examinations: Secondary | ICD-10-CM | POA: Diagnosis not present

## 2021-01-12 ENCOUNTER — Other Ambulatory Visit: Payer: Self-pay | Admitting: *Deleted

## 2021-01-12 DIAGNOSIS — M79606 Pain in leg, unspecified: Secondary | ICD-10-CM

## 2021-01-15 ENCOUNTER — Ambulatory Visit: Payer: Medicare HMO | Admitting: Vascular Surgery

## 2021-01-15 ENCOUNTER — Other Ambulatory Visit: Payer: Self-pay

## 2021-01-15 ENCOUNTER — Encounter: Payer: Self-pay | Admitting: Vascular Surgery

## 2021-01-15 ENCOUNTER — Ambulatory Visit (INDEPENDENT_AMBULATORY_CARE_PROVIDER_SITE_OTHER): Payer: Medicare HMO

## 2021-01-15 VITALS — BP 115/76 | HR 68 | Temp 97.8°F | Resp 16 | Ht 64.0 in | Wt 161.0 lb

## 2021-01-15 DIAGNOSIS — M79606 Pain in leg, unspecified: Secondary | ICD-10-CM

## 2021-01-15 NOTE — Progress Notes (Signed)
[facility] [place]  Patient name: Megan Wells MRN: [id number] DOB: 04/07/1966 Sex: female  REASON FOR CONSULT: Evaluation lower extremity discomfort swelling and discoloration  HPI: Megan Wells is a 55 y.o. female, who is here today for evaluation.  She is here with 2 family members.  She is being evaluated for discoloration pain and swelling in her lower extremities.  She has progressive ALS and is in a wheelchair.  Her main complaint is of weakness and pain in her left foot and ankle.  She wears a boot to help transfer and reports that she rolls her left ankle.  She is noted to have pitting edema in her legs from her knees distally and also persistent purplish discoloration of her feet.  She reports occasional discoloration in her hands but this is mainly in her feet.  She has no tissue loss.  Past Medical History:  Diagnosis Date  . Acquired short Achilles tendon, left   . Amyotrophic lateral sclerosis (ALS) (HCC) 12/08/2020  . Anxiety   . Hypertension   . Instability of ankle joint, left   . Left foot drop   . Numbness   . Weakness     Family History  Problem Relation Age of Onset  . Alzheimer's disease Mother   . Heart disease Father     SOCIAL HISTORY: Social History   Socioeconomic History  . Marital status: Married    Spouse name: Not on file  . Number of children: 2  . Years of education: Not on file  . Highest education level: Bachelor's degree (e.g., BA, AB, BS)  Occupational History  . Occupation: alumni relations    Comment: Doctor, general practice  Tobacco Use  . Smoking status: Former Smoker    Packs/day: 0.50    Years: 13.00    Pack years: 6.50    Types: Cigarettes    Start date: 60    Quit date: 2000    Years since quitting: 22.2  . Smokeless tobacco: Never Used  Vaping Use  . Vaping Use: Never used  Substance and Sexual Activity  . Alcohol use: No    Comment: quit March 2018   . Drug use: No  . Sexual activity: Not on file  Other Topics Concern  . Not on file  Social History Narrative   Lives at home with husband   Left handed   2 cups of coffee daily   Social Determinants of Health   Financial Resource Strain: Not on file  Food Insecurity: Not on file  Transportation Needs: Not on file  Physical Activity: Not on file  Stress: Not on file  Social Connections: Not on file  Intimate Partner Violence: Not on file    Allergies  Allergen Reactions  . Sulfa Antibiotics Hives    Current Outpatient Medications  Medication Sig Dispense Refill  . baclofen (LIORESAL) 20 MG tablet Take 20 mg by mouth 4 (four) times daily.    Marland Kitchen buPROPion (WELLBUTRIN XL) 150 MG 24 hr tablet Take 450 mg by mouth daily.    . busPIRone (BUSPAR) 15 MG tablet Take 15 mg by mouth 3 (three) times daily.    . cetirizine (ZYRTEC) 10 MG tablet Take 10 mg by mouth daily.    . clonazePAM (KLONOPIN) 0.5 MG tablet Take 1 tablet by mouth daily as needed.    . diazepam (VALIUM) 2 MG tablet Take 2 mg by mouth at bedtime.    Marland Kitchen escitalopram (LEXAPRO) 20 MG tablet  Take 20 mg by mouth daily.    . fluticasone (FLONASE) 50 MCG/ACT nasal spray Place 2 sprays into both nostrils daily.    Marland Kitchen gabapentin (NEURONTIN) 300 MG capsule Take 300 mg by mouth at bedtime.    Marland Kitchen levothyroxine (SYNTHROID) 125 MCG tablet Take by mouth.    . lithium carbonate 300 MG capsule Take 300 mg by mouth 3 (three) times daily with meals. 2 qam 1 q pm    . losartan (COZAAR) 100 MG tablet Take 50 mg by mouth daily.    . nabumetone (RELAFEN) 500 MG tablet Take 500 mg by mouth 2 (two) times daily.    Marland Kitchen tiZANidine (ZANAFLEX) 2 MG tablet Take 2 mg by mouth 3 (three) times daily.    . valACYclovir (VALTREX) 1000 MG tablet Take 2,000 mg by mouth 2 (two) times daily as needed.     No current facility-administered medications for this visit.    REVIEW OF SYSTEMS:  [X]  denotes positive finding, [ ]  denotes negative finding Cardiac   Comments:  Chest pain or chest pressure:    Shortness of breath upon exertion: x   Short of breath when lying flat: x   Irregular heart rhythm:        Vascular    Pain in calf, thigh, or hip brought on by ambulation:    Pain in feet at night that wakes you up from your sleep:     Blood clot in your veins:    Leg swelling:  x       Pulmonary    Oxygen at home:    Productive cough:     Wheezing:         Neurologic    Sudden weakness in arms or legs:  x   Sudden numbness in arms or legs:  x   Sudden onset of difficulty speaking or slurred speech: x   Temporary loss of vision in one eye:     Problems with dizziness:         Gastrointestinal    Blood in stool:     Vomited blood:         Genitourinary    Burning when urinating:     Blood in urine:        Psychiatric    Major depression:  x       Hematologic    Bleeding problems:    Problems with blood clotting too easily:        Skin    Rashes or ulcers:        Constitutional    Fever or chills:      PHYSICAL EXAM: Vitals:   01/15/21 1602  BP: 115/76  Pulse: 68  Resp: 16  Temp: 97.8 F (36.6 C)  TempSrc: Other (Comment)  SpO2: 94%  Weight: 161 lb (73 kg)  Height: 5\' 4"  (1.626 m)    GENERAL: The patient is a well-nourished female, in no acute distress. The vital signs are documented above. CARDIOVASCULAR: 2+ radial pulses bilaterally.  I did not palpate pedal pulses.  She does have significant pitting edema making pulse interrogation somewhat difficult. PULMONARY: There is good air exchange  MUSCULOSKELETAL: There are no major deformities or cyanosis. NEUROLOGIC: No focal weakness or paresthesias are detected. SKIN: There are no ulcers or rashes noted.  He does have a fixed purplish cyanotic tent to her feet bilaterally and her feet are cool PSYCHIATRIC: The patient has a normal affect.  DATA:  Noninvasive studies revealed normal ankle  arm index bilaterally at greater than 1.0.  She has normal triphasic  waveforms at the dorsalis pedis and posterior tibial level bilaterally.  MEDICAL ISSUES: I discussed this at length with the patient and her family present.  I do not see any evidence of arterial insufficiency.  I explained that she does have diminished flow to the level of her skin causing discoloration and coolness.  I do not feel this has any association with pain in her left ankle and foot.  She is questioning the etiology of her leg swelling.  I explained that with her dependency in the wheelchair and also loss of any muscle pump that she is having congestion in her lower extremities.  Explained the role of elevation when possible and also compression garments.  Patient will see [place] again on an as-needed basis   Larina Earthly, MD [facility] [phone] Pager [phone]  Note: Portions of this report may have been transcribed using voice recognition software.  Every effort has been made to ensure accuracy; however, inadvertent computerized transcription errors may still be present.

## 2021-01-16 DIAGNOSIS — G1221 Amyotrophic lateral sclerosis: Secondary | ICD-10-CM | POA: Diagnosis not present

## 2021-01-16 DIAGNOSIS — R69 Illness, unspecified: Secondary | ICD-10-CM | POA: Diagnosis not present

## 2021-01-16 DIAGNOSIS — Z86010 Personal history of colonic polyps: Secondary | ICD-10-CM | POA: Diagnosis not present

## 2021-01-16 DIAGNOSIS — M24575 Contracture, left foot: Secondary | ICD-10-CM | POA: Diagnosis not present

## 2021-01-16 DIAGNOSIS — M24572 Contracture, left ankle: Secondary | ICD-10-CM | POA: Diagnosis not present

## 2021-01-16 DIAGNOSIS — Z993 Dependence on wheelchair: Secondary | ICD-10-CM | POA: Diagnosis not present

## 2021-01-16 DIAGNOSIS — E039 Hypothyroidism, unspecified: Secondary | ICD-10-CM | POA: Diagnosis not present

## 2021-01-18 DIAGNOSIS — R69 Illness, unspecified: Secondary | ICD-10-CM | POA: Diagnosis not present

## 2021-01-18 DIAGNOSIS — M24575 Contracture, left foot: Secondary | ICD-10-CM | POA: Diagnosis not present

## 2021-01-18 DIAGNOSIS — G1221 Amyotrophic lateral sclerosis: Secondary | ICD-10-CM | POA: Diagnosis not present

## 2021-01-18 DIAGNOSIS — Z86010 Personal history of colonic polyps: Secondary | ICD-10-CM | POA: Diagnosis not present

## 2021-01-18 DIAGNOSIS — E039 Hypothyroidism, unspecified: Secondary | ICD-10-CM | POA: Diagnosis not present

## 2021-01-18 DIAGNOSIS — M24572 Contracture, left ankle: Secondary | ICD-10-CM | POA: Diagnosis not present

## 2021-01-18 DIAGNOSIS — Z993 Dependence on wheelchair: Secondary | ICD-10-CM | POA: Diagnosis not present

## 2021-01-19 DIAGNOSIS — Z86010 Personal history of colonic polyps: Secondary | ICD-10-CM | POA: Diagnosis not present

## 2021-01-19 DIAGNOSIS — R69 Illness, unspecified: Secondary | ICD-10-CM | POA: Diagnosis not present

## 2021-01-19 DIAGNOSIS — M24572 Contracture, left ankle: Secondary | ICD-10-CM | POA: Diagnosis not present

## 2021-01-19 DIAGNOSIS — G1221 Amyotrophic lateral sclerosis: Secondary | ICD-10-CM | POA: Diagnosis not present

## 2021-01-19 DIAGNOSIS — M24575 Contracture, left foot: Secondary | ICD-10-CM | POA: Diagnosis not present

## 2021-01-19 DIAGNOSIS — E039 Hypothyroidism, unspecified: Secondary | ICD-10-CM | POA: Diagnosis not present

## 2021-01-19 DIAGNOSIS — Z993 Dependence on wheelchair: Secondary | ICD-10-CM | POA: Diagnosis not present

## 2021-01-23 DIAGNOSIS — M24575 Contracture, left foot: Secondary | ICD-10-CM | POA: Diagnosis not present

## 2021-01-23 DIAGNOSIS — Z86010 Personal history of colonic polyps: Secondary | ICD-10-CM | POA: Diagnosis not present

## 2021-01-23 DIAGNOSIS — G1221 Amyotrophic lateral sclerosis: Secondary | ICD-10-CM | POA: Diagnosis not present

## 2021-01-23 DIAGNOSIS — Z993 Dependence on wheelchair: Secondary | ICD-10-CM | POA: Diagnosis not present

## 2021-01-23 DIAGNOSIS — E039 Hypothyroidism, unspecified: Secondary | ICD-10-CM | POA: Diagnosis not present

## 2021-01-23 DIAGNOSIS — M24572 Contracture, left ankle: Secondary | ICD-10-CM | POA: Diagnosis not present

## 2021-01-23 DIAGNOSIS — R69 Illness, unspecified: Secondary | ICD-10-CM | POA: Diagnosis not present

## 2021-01-25 DIAGNOSIS — E039 Hypothyroidism, unspecified: Secondary | ICD-10-CM | POA: Diagnosis not present

## 2021-01-25 DIAGNOSIS — M24572 Contracture, left ankle: Secondary | ICD-10-CM | POA: Diagnosis not present

## 2021-01-25 DIAGNOSIS — G1221 Amyotrophic lateral sclerosis: Secondary | ICD-10-CM | POA: Diagnosis not present

## 2021-01-25 DIAGNOSIS — R69 Illness, unspecified: Secondary | ICD-10-CM | POA: Diagnosis not present

## 2021-01-25 DIAGNOSIS — Z86010 Personal history of colonic polyps: Secondary | ICD-10-CM | POA: Diagnosis not present

## 2021-01-25 DIAGNOSIS — Z993 Dependence on wheelchair: Secondary | ICD-10-CM | POA: Diagnosis not present

## 2021-01-25 DIAGNOSIS — M24575 Contracture, left foot: Secondary | ICD-10-CM | POA: Diagnosis not present

## 2021-01-31 DIAGNOSIS — I10 Essential (primary) hypertension: Secondary | ICD-10-CM | POA: Diagnosis not present

## 2021-01-31 DIAGNOSIS — E039 Hypothyroidism, unspecified: Secondary | ICD-10-CM | POA: Diagnosis not present

## 2021-03-03 DIAGNOSIS — I10 Essential (primary) hypertension: Secondary | ICD-10-CM | POA: Diagnosis not present

## 2021-03-03 DIAGNOSIS — E039 Hypothyroidism, unspecified: Secondary | ICD-10-CM | POA: Diagnosis not present

## 2021-03-20 DIAGNOSIS — R1312 Dysphagia, oropharyngeal phase: Secondary | ICD-10-CM | POA: Diagnosis not present

## 2021-03-20 DIAGNOSIS — Z789 Other specified health status: Secondary | ICD-10-CM | POA: Diagnosis not present

## 2021-03-20 DIAGNOSIS — R252 Cramp and spasm: Secondary | ICD-10-CM | POA: Diagnosis not present

## 2021-03-20 DIAGNOSIS — R471 Dysarthria and anarthria: Secondary | ICD-10-CM | POA: Diagnosis not present

## 2021-03-20 DIAGNOSIS — G1221 Amyotrophic lateral sclerosis: Secondary | ICD-10-CM | POA: Diagnosis not present

## 2021-06-07 DIAGNOSIS — R69 Illness, unspecified: Secondary | ICD-10-CM | POA: Diagnosis not present

## 2021-06-12 DIAGNOSIS — G1221 Amyotrophic lateral sclerosis: Secondary | ICD-10-CM | POA: Diagnosis not present

## 2021-06-12 DIAGNOSIS — M7989 Other specified soft tissue disorders: Secondary | ICD-10-CM | POA: Diagnosis not present

## 2021-06-12 DIAGNOSIS — M6281 Muscle weakness (generalized): Secondary | ICD-10-CM | POA: Diagnosis not present

## 2021-06-12 DIAGNOSIS — R252 Cramp and spasm: Secondary | ICD-10-CM | POA: Diagnosis not present

## 2021-06-12 DIAGNOSIS — R69 Illness, unspecified: Secondary | ICD-10-CM | POA: Diagnosis not present

## 2021-06-12 DIAGNOSIS — R1312 Dysphagia, oropharyngeal phase: Secondary | ICD-10-CM | POA: Diagnosis not present

## 2021-06-12 DIAGNOSIS — R471 Dysarthria and anarthria: Secondary | ICD-10-CM | POA: Diagnosis not present

## 2021-06-12 DIAGNOSIS — M25373 Other instability, unspecified ankle: Secondary | ICD-10-CM | POA: Diagnosis not present

## 2021-06-12 DIAGNOSIS — K117 Disturbances of salivary secretion: Secondary | ICD-10-CM | POA: Diagnosis not present

## 2021-06-12 DIAGNOSIS — G4700 Insomnia, unspecified: Secondary | ICD-10-CM | POA: Diagnosis not present

## 2021-06-25 DIAGNOSIS — M6281 Muscle weakness (generalized): Secondary | ICD-10-CM | POA: Diagnosis not present

## 2021-06-25 DIAGNOSIS — E039 Hypothyroidism, unspecified: Secondary | ICD-10-CM | POA: Diagnosis not present

## 2021-06-25 DIAGNOSIS — G1221 Amyotrophic lateral sclerosis: Secondary | ICD-10-CM | POA: Diagnosis not present

## 2021-06-25 DIAGNOSIS — I10 Essential (primary) hypertension: Secondary | ICD-10-CM | POA: Diagnosis not present

## 2021-06-25 DIAGNOSIS — Z0001 Encounter for general adult medical examination with abnormal findings: Secondary | ICD-10-CM | POA: Diagnosis not present

## 2021-06-25 DIAGNOSIS — R69 Illness, unspecified: Secondary | ICD-10-CM | POA: Diagnosis not present

## 2021-07-04 DIAGNOSIS — E039 Hypothyroidism, unspecified: Secondary | ICD-10-CM | POA: Diagnosis not present

## 2021-07-04 DIAGNOSIS — I10 Essential (primary) hypertension: Secondary | ICD-10-CM | POA: Diagnosis not present

## 2021-09-04 DIAGNOSIS — F331 Major depressive disorder, recurrent, moderate: Secondary | ICD-10-CM | POA: Diagnosis not present

## 2021-09-04 DIAGNOSIS — R69 Illness, unspecified: Secondary | ICD-10-CM | POA: Diagnosis not present

## 2021-09-05 DIAGNOSIS — G1221 Amyotrophic lateral sclerosis: Secondary | ICD-10-CM | POA: Diagnosis not present

## 2021-09-05 DIAGNOSIS — G9389 Other specified disorders of brain: Secondary | ICD-10-CM | POA: Diagnosis not present

## 2021-09-05 DIAGNOSIS — I639 Cerebral infarction, unspecified: Secondary | ICD-10-CM | POA: Diagnosis not present

## 2021-09-05 DIAGNOSIS — R69 Illness, unspecified: Secondary | ICD-10-CM | POA: Diagnosis not present

## 2021-09-05 DIAGNOSIS — R29898 Other symptoms and signs involving the musculoskeletal system: Secondary | ICD-10-CM | POA: Diagnosis not present

## 2021-09-05 DIAGNOSIS — Z66 Do not resuscitate: Secondary | ICD-10-CM | POA: Diagnosis not present

## 2021-09-05 DIAGNOSIS — Z20822 Contact with and (suspected) exposure to covid-19: Secondary | ICD-10-CM | POA: Diagnosis not present

## 2021-09-05 DIAGNOSIS — M6281 Muscle weakness (generalized): Secondary | ICD-10-CM | POA: Diagnosis not present

## 2021-09-05 DIAGNOSIS — Z23 Encounter for immunization: Secondary | ICD-10-CM | POA: Diagnosis not present

## 2021-09-05 DIAGNOSIS — R4781 Slurred speech: Secondary | ICD-10-CM | POA: Diagnosis not present

## 2021-09-05 DIAGNOSIS — E039 Hypothyroidism, unspecified: Secondary | ICD-10-CM | POA: Diagnosis not present

## 2021-09-05 DIAGNOSIS — I10 Essential (primary) hypertension: Secondary | ICD-10-CM | POA: Diagnosis not present

## 2021-09-05 DIAGNOSIS — Z79890 Hormone replacement therapy (postmenopausal): Secondary | ICD-10-CM | POA: Diagnosis not present

## 2021-09-05 DIAGNOSIS — R531 Weakness: Secondary | ICD-10-CM | POA: Diagnosis not present

## 2021-09-05 DIAGNOSIS — Z79899 Other long term (current) drug therapy: Secondary | ICD-10-CM | POA: Diagnosis not present

## 2021-09-06 DIAGNOSIS — E039 Hypothyroidism, unspecified: Secondary | ICD-10-CM | POA: Diagnosis not present

## 2021-09-06 DIAGNOSIS — R69 Illness, unspecified: Secondary | ICD-10-CM | POA: Diagnosis not present

## 2021-09-06 DIAGNOSIS — I10 Essential (primary) hypertension: Secondary | ICD-10-CM | POA: Diagnosis not present

## 2021-09-06 DIAGNOSIS — R4781 Slurred speech: Secondary | ICD-10-CM | POA: Diagnosis not present

## 2021-09-06 DIAGNOSIS — G1221 Amyotrophic lateral sclerosis: Secondary | ICD-10-CM | POA: Diagnosis not present

## 2021-09-18 DIAGNOSIS — R252 Cramp and spasm: Secondary | ICD-10-CM | POA: Diagnosis not present

## 2021-09-18 DIAGNOSIS — M6281 Muscle weakness (generalized): Secondary | ICD-10-CM | POA: Diagnosis not present

## 2021-09-18 DIAGNOSIS — R1312 Dysphagia, oropharyngeal phase: Secondary | ICD-10-CM | POA: Diagnosis not present

## 2021-09-18 DIAGNOSIS — G1221 Amyotrophic lateral sclerosis: Secondary | ICD-10-CM | POA: Diagnosis not present

## 2021-09-18 DIAGNOSIS — G4700 Insomnia, unspecified: Secondary | ICD-10-CM | POA: Diagnosis not present

## 2021-09-18 DIAGNOSIS — R52 Pain, unspecified: Secondary | ICD-10-CM | POA: Diagnosis not present

## 2021-09-18 DIAGNOSIS — R471 Dysarthria and anarthria: Secondary | ICD-10-CM | POA: Diagnosis not present

## 2021-09-21 ENCOUNTER — Telehealth: Payer: Self-pay

## 2021-09-21 NOTE — Telephone Encounter (Signed)
(  2:08 pm) SW scheduled an initial palliative care visit with patient. Palliative RN/SW visit scheduled for 10/04/21 @ 12:15 pm.

## 2021-09-26 DIAGNOSIS — G4701 Insomnia due to medical condition: Secondary | ICD-10-CM | POA: Diagnosis not present

## 2021-09-26 DIAGNOSIS — Z515 Encounter for palliative care: Secondary | ICD-10-CM | POA: Diagnosis not present

## 2021-09-26 DIAGNOSIS — G1221 Amyotrophic lateral sclerosis: Secondary | ICD-10-CM | POA: Diagnosis not present

## 2021-10-04 ENCOUNTER — Other Ambulatory Visit: Payer: Medicare HMO

## 2021-10-04 ENCOUNTER — Other Ambulatory Visit: Payer: Medicare HMO | Admitting: *Deleted

## 2021-10-04 ENCOUNTER — Other Ambulatory Visit: Payer: Self-pay

## 2021-10-04 VITALS — BP 138/92 | HR 71 | Temp 98.0°F | Resp 16

## 2021-10-04 DIAGNOSIS — Z515 Encounter for palliative care: Secondary | ICD-10-CM

## 2021-10-04 NOTE — Progress Notes (Signed)
COMMUNITY PALLIATIVE CARE SW NOTE  PATIENT NAME: Megan Wells DOB: 1966-11-04 MRN: [id number]  PRIMARY CARE PROVIDER: Estanislado Pandy, MD  RESPONSIBLE PARTY:  Acct ID - Guarantor Home Phone Work Phone Relationship Acct Type  [id number] Thea Gist* [phone]  Self P/F     [address] [place], [place] [address]     PLAN OF CARE and INTERVENTIONS:             GOALS OF CARE/ ADVANCE CARE PLANNING:  Goal is for patient to be comfortable and remain in her home.  SOCIAL/EMOTIONAL/SPIRITUAL ASSESSMENT/ INTERVENTIONS:  SW and RN-M. Dimas Aguas completed an initial visit with patient at her home. Patient was in bed and present with her two sons and sitter. Education regarding palliative care services was provided to the patient and family and verbal consent was provided for ongoing services. Patient is diagnosed with ALS. Patient appeared to be lethargic and her verbalizations were difficult to understand. Patient expressed that she was in pain and did not know "how long she could stand this". Patient takes Tylenol for pain which has not been effective. Her  sons and sitter advised that patient complains of pain constantly whether she in bed or in her recliner. Patient is total care and his transferred via hoyer lift. Patient has a caregiver 6/8 hours per day through [facility]. Patient appetite has declined significantly, but they try to give her at least two small meals a day. SW to provided supportive presence, active listening, assessment of patient needs and comfort, observation and reassurance of support. Patient and family remain open to ongoing palliative care visits/support.  PATIENT/CAREGIVER EDUCATION/ COPING:  Patient is experiencing a great deal of patient. RN to reach out to her PCP to discuss interventions.  PERSONAL EMERGENCY PLAN:  911 can be accessed for emergencies.  COMMUNITY RESOURCES COORDINATION/ HEALTH CARE NAVIGATION:  Patient has a private caregiver through [facility]. FINANCIAL/LEGAL  CONCERNS/INTERVENTIONS:  None.     SOCIAL HX:  Social History   Tobacco Use   Smoking status: Former    Packs/day: 0.50    Years: 13.00    Pack years: 6.50    Types: Cigarettes    Start date: 76    Quit date: 2000    Years since quitting: 22.9   Smokeless tobacco: Never  Substance Use Topics   Alcohol use: No    Comment: quit March 2018    CODE STATUS: To be assessed ADVANCED DIRECTIVES: No MOST FORM COMPLETE:  No HOSPICE EDUCATION PROVIDED: No  PPS: Patient is alert and oriented to self. She is total care.  Duration of visit and documentation: 45 minutes.  [address] [place], [place]

## 2021-10-05 ENCOUNTER — Telehealth: Payer: Self-pay | Admitting: *Deleted

## 2021-10-05 DIAGNOSIS — Z515 Encounter for palliative care: Secondary | ICD-10-CM

## 2021-10-05 NOTE — Telephone Encounter (Signed)
I visited with patient yesterday and she was c/o back pain and difficulty getting comfortable. She became emotional during visit saying she does not know if she can continue to stand the pain.They are currently giving her Tylenol and Ibuprofen every 6 hours around the clock, but it is really not effective per son. I contacted and spoke with her NP, Megan Wells from the [facility] clinic. Dr. Zannie Kehr was out of the office today. She advised that the son also contacted her and she has instructed him to increase her Gabapentin to 300 mg 3x/day (previously 300 mg daily) and still has room to titrate up. [facility] will continue to assess her pain and medication effectiveness. I left a voicemail for patient's son to advise that I spoke with the NP and she made me aware of the medication increase and to contact us with any questions/concerns.

## 2021-10-05 NOTE — Progress Notes (Signed)
[facility] PALLIATIVE CARE RN NOTE  PATIENT NAME: Megan Wells DOB: February 20, 1966 MRN: [id number]  PRIMARY CARE PROVIDER: Manon Hilding, MD  RESPONSIBLE PARTY:  Acct ID - Guarantor Home Phone Work Phone Relationship Acct Type  [id number] Megan Wells* [phone]  Self P/F     [address], [place], [place] [address]   Covid-19 Pre-screening Negative  PLAN OF CARE and INTERVENTION:  ADVANCE CARE PLANNING/GOALS OF CARE: Goal is for patient to remain in her home with her family and to have better pain control. PATIENT/CAREGIVER EDUCATION: Explained palliative care services, pain management, safe transfers, s/s of infection DISEASE STATUS: Joint visit made with LCSW, M. Lonon. Met with patient, son's and hired caregiver in the home. Patient is currently lying in bed asleep. She appears to be lethargic once she arouses. Her speech is very limited and difficult to understand due to her ALS diagnosis. Family and hired caregiver reports that patient has difficulty getting comfortable due to back and bottom pain. She spends most of her time either lying in her hospital bed or sitting up in the recliner. Once in the recliner, she is only able to tolerate this for about 20 minutes and is then requesting to get back in bed. She became emotional stating that she doesn't know how much longer she can continue with this pain. She is currently taking Tylenol and Ibuprofen every 6 hours, and the effects do not last very long. She is total care with all ADLs and transferred via [place]. She is incontinent of both bowel and bladder and wears Depends. She has contractures noted to both hands and to right arm. She has a hired caregiver through [facility] that comes daily for 6-8 hours. Her appetite continues to decline. She eats 1-2 small meals/day. She had started having difficulties swallowing her medications so they are now being given in applesauce without difficulty. She was recently assessed for hospice  services. She does meet eligibility requirements, however opted against it at this time as she is working on getting a communication device that would allow her to continue to communicate by using her eye movements. Family states that she does have some intermittent confusion and disorientation. Advised will reach out to Dr. Hosie Poisson with [facility] to discuss pain management. They are agreeable to receive future visits with palliative care.   HISTORY OF PRESENT ILLNESS: This is a 55 yo female with a diagnosis of ALS. She has a history of hypertension, hypothyroidism, spasticity and depression. Palliative care team has been asked to follow patient for additional support, goals of care and complex decision making.  CODE STATUS: To be assessed ADVANCED DIRECTIVES: N MOST FORM: no PPS: 30%   PHYSICAL EXAM:   VITALS: Today's Vitals   10/04/21 1240  BP: (!) 138/92  Pulse: 71  Resp: 16  Temp: 98 F (36.7 C)  TempSrc: Temporal  SpO2: 98%  PainSc: 6     LUNGS: clear to auscultation  CARDIAC: Cor RRR EXTREMITIES: No edema SKIN:  Exposed skin is dry and intact; caregiver and family deny any skin breakdown   NEURO:  Alert and oriented to person/place, speech limited and difficult to understand due to progression of ALS, generalized weakness, total care for ADLs   (Duration of visit and documentation 60 minutes)   Daryl Eastern, RN BSN

## 2021-10-06 DIAGNOSIS — G1221 Amyotrophic lateral sclerosis: Secondary | ICD-10-CM | POA: Diagnosis not present

## 2021-10-06 DIAGNOSIS — R69 Illness, unspecified: Secondary | ICD-10-CM | POA: Diagnosis not present

## 2021-10-19 DIAGNOSIS — R300 Dysuria: Secondary | ICD-10-CM | POA: Diagnosis not present

## 2021-10-20 DIAGNOSIS — R300 Dysuria: Secondary | ICD-10-CM | POA: Diagnosis not present

## 2021-10-21 DIAGNOSIS — R300 Dysuria: Secondary | ICD-10-CM | POA: Diagnosis not present

## 2021-11-04 DEATH — deceased

## 2022-01-01 ENCOUNTER — Telehealth: Payer: Self-pay

## 2022-01-01 NOTE — Telephone Encounter (Signed)
Attempted to reach patient and patient's husband. Phones not accepting calls at this time.

## 2022-01-04 ENCOUNTER — Telehealth: Payer: Self-pay

## 2022-01-04 NOTE — Telephone Encounter (Signed)
Spoke with patient's son, Angelica Chessman. Patient passed away in 11/02/2021. Condolences expressed.  ?
